# Patient Record
Sex: Male | Born: 1995 | Race: Black or African American | Hispanic: No | Marital: Single | Smoking: Current every day smoker
Health system: Southern US, Community
[De-identification: ages and names within clinical notes are randomized; demographics above are authoritative.]

## PROBLEM LIST (undated history)

## (undated) DIAGNOSIS — J45909 Unspecified asthma, uncomplicated: Secondary | ICD-10-CM

---

## 2016-04-14 ENCOUNTER — Emergency Department (HOSPITAL_BASED_OUTPATIENT_CLINIC_OR_DEPARTMENT_OTHER)
Admission: EM | Admit: 2016-04-14 | Discharge: 2016-04-14 | Disposition: A | Discharge status: Home / Self Care | Payer: Self-pay | Attending: Emergency Medicine | Admitting: Emergency Medicine

## 2016-04-14 ENCOUNTER — Encounter (HOSPITAL_BASED_OUTPATIENT_CLINIC_OR_DEPARTMENT_OTHER): Payer: Self-pay | Admitting: Emergency Medicine

## 2016-04-14 DIAGNOSIS — R1110 Vomiting, unspecified: Secondary | ICD-10-CM

## 2016-04-14 DIAGNOSIS — R112 Nausea with vomiting, unspecified: Secondary | ICD-10-CM | POA: Insufficient documentation

## 2016-04-14 DIAGNOSIS — R197 Diarrhea, unspecified: Secondary | ICD-10-CM | POA: Insufficient documentation

## 2016-04-14 DIAGNOSIS — F17210 Nicotine dependence, cigarettes, uncomplicated: Secondary | ICD-10-CM | POA: Insufficient documentation

## 2016-04-14 LAB — COMPREHENSIVE METABOLIC PANEL
ALBUMIN: 4.4 g/dL (ref 3.5–5.0)
ALK PHOS: 55 U/L (ref 38–126)
ALT: 13 U/L — AB (ref 17–63)
ANION GAP: 8 (ref 5–15)
AST: 22 U/L (ref 15–41)
BILIRUBIN TOTAL: 0.8 mg/dL (ref 0.3–1.2)
BUN: 6 mg/dL (ref 6–20)
CALCIUM: 9.3 mg/dL (ref 8.9–10.3)
CO2: 24 mmol/L (ref 22–32)
CREATININE: 0.83 mg/dL (ref 0.61–1.24)
Chloride: 106 mmol/L (ref 101–111)
GFR calc non Af Amer: >60 mL/min (ref >60)
GLUCOSE: 99 mg/dL (ref 65–99)
Potassium: 3.9 mmol/L (ref 3.5–5.1)
SODIUM: 138 mmol/L (ref 135–145)
TOTAL PROTEIN: 7.7 g/dL (ref 6.5–8.1)

## 2016-04-14 LAB — URINALYSIS, ROUTINE W REFLEX MICROSCOPIC
BILIRUBIN URINE: NEGATIVE
GLUCOSE, UA: NEGATIVE mg/dL
Hgb urine dipstick: NEGATIVE
KETONES UR: NEGATIVE mg/dL
Leukocytes, UA: NEGATIVE
NITRITE: NEGATIVE
PH: 6 (ref 5.0–8.0)
Protein, ur: NEGATIVE mg/dL
SPECIFIC GRAVITY, URINE: 1.025 (ref 1.005–1.030)

## 2016-04-14 LAB — CBC
HCT: 43.5 % (ref 39.0–52.0)
HEMOGLOBIN: 14.8 g/dL (ref 13.0–17.0)
MCH: 31.3 pg (ref 26.0–34.0)
MCHC: 34 g/dL (ref 30.0–36.0)
MCV: 92 fL (ref 78.0–100.0)
PLATELETS: 318 10*3/uL (ref 150–400)
RBC: 4.73 MIL/uL (ref 4.22–5.81)
RDW: 12.8 % (ref 11.5–15.5)
WBC: 8.8 10*3/uL (ref 4.0–10.5)

## 2016-04-14 LAB — LIPASE, BLOOD: Lipase: 12 U/L (ref 11–51)

## 2016-04-14 MED ORDER — SODIUM CHLORIDE 0.9 % IV BOLUS (SEPSIS)
1000.0000 mL | Freq: Once | INTRAVENOUS | Status: AC
  Administered 2016-04-14: 1000 mL via INTRAVENOUS

## 2016-04-14 MED ORDER — ONDANSETRON HCL 4 MG/2ML IJ SOLN
4.0000 mg | Freq: Once | INTRAMUSCULAR | Status: AC
  Administered 2016-04-14: 4 mg via INTRAVENOUS
  Filled 2016-04-14: qty 2

## 2016-04-14 MED ORDER — PROMETHAZINE HCL 25 MG PO TABS: 25.0000 mg | ORAL_TABLET | Freq: Four times a day (QID) | ORAL | Status: DC | PRN

## 2016-04-14 NOTE — Discharge Instructions (Signed)

## 2016-04-14 NOTE — ED Notes (Signed)
Pt called in waiting area, no response.

## 2016-04-14 NOTE — ED Notes (Signed)
Pt reports abdominal pain associated with n/v and diarrhea since 7am

## 2016-04-14 NOTE — ED Provider Notes (Signed)
CSN: [id number]     Arrival date & time 04/14/16  1159 History  By signing my name below, I, Jerry Acosta, attest that this documentation has been prepared under the direction and in the presence of Linwood DibblesJon Quinteria Chisum, MD. Electronically Signed: Bethel BornBritney Acosta, ED Scribe. 04/14/2016. 3:54 PM   Chief Complaint  Patient presents with  . Abdominal Pain   The history is provided by the patient. No language interpreter was used.   Jerry Acosta is a 20 y.o. male who presents to the Emergency Department complaining of new, constant, diffuse abdominal pain with onset this morning. The pain is exacerbated by certain movements though he notes that it has improved since initial onset. Associated symptoms include 8 episodes of emesis and 3 episodes of diarrhea today. Pt denies fever and difficulty urinating.   History reviewed. No pertinent past medical history. History reviewed. No pertinent past surgical history. History reviewed. No pertinent family history. Social History  Substance Use Topics  . Smoking status: Current Every Day Smoker -- 0.50 packs/day    Types: Cigarettes  . Smokeless tobacco: None  . Alcohol Use: No    Review of Systems  Constitutional: Negative for fever.  Gastrointestinal: Positive for nausea, vomiting, abdominal pain and diarrhea. Negative for blood in stool.  Genitourinary: Negative for difficulty urinating.  All other systems reviewed and are negative.  Allergies  Review of patient's allergies indicates not on file.  Home Medications   Prior to Admission medications   Medication Sig Start Date End Date Taking? Authorizing Provider  promethazine (PHENERGAN) 25 MG tablet Take 1 tablet (25 mg total) by mouth every 6 (six) hours as needed for nausea or vomiting. 04/14/16   Linwood DibblesJon Ajit Errico, MD   BP 133/59 mmHg  Pulse 78  Temp(Src) 97.7 F (36.5 C) (Oral)  Resp 18  Ht 5\' 6"  (1.676 m)  Wt 130 lb (58.968 kg)  BMI 20.99 kg/m2  SpO2 100% Physical Exam  Constitutional: He  appears well-developed and well-nourished. No distress.  HENT:  Head: Normocephalic and atraumatic.  Right Ear: External ear normal.  Left Ear: External ear normal.  Eyes: Conjunctivae are normal. Right eye exhibits no discharge. Left eye exhibits no discharge. No scleral icterus.  Neck: Neck supple. No tracheal deviation present.  Cardiovascular: Normal rate, regular rhythm and intact distal pulses.   Pulmonary/Chest: Effort normal and breath sounds normal. No stridor. No respiratory distress. He has no wheezes. He has no rales.  Abdominal: Soft. Bowel sounds are normal. He exhibits no distension. There is no tenderness. There is no rebound and no guarding.  Musculoskeletal: He exhibits no edema or tenderness.  Neurological: He is alert. He has normal strength. No cranial nerve deficit (no facial droop, extraocular movements intact, no slurred speech) or sensory deficit. He exhibits normal muscle tone. He displays no seizure activity. Coordination normal.  Skin: Skin is warm and dry. No rash noted.  Psychiatric: He has a normal mood and affect.  Nursing note and vitals reviewed.   ED Course  Procedures (including critical care time) DIAGNOSTIC STUDIES: Oxygen Saturation is 100% on RA,  normal by my interpretation.    COORDINATION OF CARE: 3:23 PM Discussed treatment plan which includes lab work, IVF, and antiemetic medication with pt at bedside and pt agreed to plan.  Labs Review Labs Reviewed  COMPREHENSIVE METABOLIC PANEL - Abnormal; Notable for the following:    ALT 13 (*)    All other components within normal limits  URINALYSIS, ROUTINE W REFLEX MICROSCOPIC (NOT AT  [facility])  LIPASE, BLOOD  CBC     MDM   Final diagnoses:  Vomiting and diarrhea   The patient's abdominal exam is reassuring. He has no tenderness. I suspect his symptoms may be related to a viral illness. I doubt appendicitis. Plan on discharge home with prescription for Phenergan. Warning signs and precautions  discussed.  I personally performed the services described in this documentation, which was scribed in my presence.  The recorded information has been reviewed and is accurate.   Linwood Dibbles, MD 04/14/16 [phone]

## 2017-05-28 ENCOUNTER — Encounter (HOSPITAL_BASED_OUTPATIENT_CLINIC_OR_DEPARTMENT_OTHER): Payer: Self-pay | Admitting: Emergency Medicine

## 2017-05-28 ENCOUNTER — Emergency Department (HOSPITAL_BASED_OUTPATIENT_CLINIC_OR_DEPARTMENT_OTHER): Payer: Medicaid Other

## 2017-05-28 ENCOUNTER — Emergency Department (HOSPITAL_BASED_OUTPATIENT_CLINIC_OR_DEPARTMENT_OTHER)
Admission: EM | Admit: 2017-05-28 | Discharge: 2017-05-28 | Disposition: A | Discharge status: Home / Self Care | Payer: Medicaid Other | Attending: Emergency Medicine | Admitting: Emergency Medicine

## 2017-05-28 DIAGNOSIS — R0602 Shortness of breath: Secondary | ICD-10-CM | POA: Insufficient documentation

## 2017-05-28 DIAGNOSIS — R509 Fever, unspecified: Secondary | ICD-10-CM | POA: Insufficient documentation

## 2017-05-28 DIAGNOSIS — F17210 Nicotine dependence, cigarettes, uncomplicated: Secondary | ICD-10-CM | POA: Insufficient documentation

## 2017-05-28 DIAGNOSIS — J45909 Unspecified asthma, uncomplicated: Secondary | ICD-10-CM | POA: Insufficient documentation

## 2017-05-28 DIAGNOSIS — J189 Pneumonia, unspecified organism: Secondary | ICD-10-CM | POA: Insufficient documentation

## 2017-05-28 DIAGNOSIS — R0981 Nasal congestion: Secondary | ICD-10-CM | POA: Insufficient documentation

## 2017-05-28 HISTORY — DX: Unspecified asthma, uncomplicated: J45.909

## 2017-05-28 LAB — COMPREHENSIVE METABOLIC PANEL
ALBUMIN: 3.7 g/dL (ref 3.5–5.0)
ALT: 15 U/L — ABNORMAL LOW (ref 17–63)
AST: 27 U/L (ref 15–41)
Alkaline Phosphatase: 46 U/L (ref 38–126)
Anion gap: 10 (ref 5–15)
BUN: 7 mg/dL (ref 6–20)
CO2: 23 mmol/L (ref 22–32)
Calcium: 8.6 mg/dL — ABNORMAL LOW (ref 8.9–10.3)
Chloride: 104 mmol/L (ref 101–111)
Creatinine, Ser: 0.84 mg/dL (ref 0.61–1.24)
GFR calc Af Amer: >60 mL/min (ref >60)
GFR calc non Af Amer: >60 mL/min (ref >60)
GLUCOSE: 125 mg/dL — AB (ref 65–99)
POTASSIUM: 3.5 mmol/L (ref 3.5–5.1)
SODIUM: 137 mmol/L (ref 135–145)
Total Bilirubin: 0.7 mg/dL (ref 0.3–1.2)
Total Protein: 6.3 g/dL — ABNORMAL LOW (ref 6.5–8.1)

## 2017-05-28 LAB — CBC WITH DIFFERENTIAL/PLATELET
Basophils Absolute: 0 10*3/uL (ref 0.0–0.1)
Basophils Relative: 0 %
Eosinophils Absolute: 0 10*3/uL (ref 0.0–0.7)
Eosinophils Relative: 0 %
HEMATOCRIT: 39.8 % (ref 39.0–52.0)
Hemoglobin: 13.6 g/dL (ref 13.0–17.0)
LYMPHS PCT: 6 %
Lymphs Abs: 0.6 10*3/uL — ABNORMAL LOW (ref 0.7–4.0)
MCH: 31.1 pg (ref 26.0–34.0)
MCHC: 34.2 g/dL (ref 30.0–36.0)
MCV: 90.9 fL (ref 78.0–100.0)
MONO ABS: 1.3 10*3/uL — AB (ref 0.1–1.0)
MONOS PCT: 13 %
NEUTROS ABS: 8.2 10*3/uL — AB (ref 1.7–7.7)
Neutrophils Relative %: 81 %
Platelets: 308 10*3/uL (ref 150–400)
RBC: 4.38 MIL/uL (ref 4.22–5.81)
RDW: 13 % (ref 11.5–15.5)
WBC: 10.2 10*3/uL (ref 4.0–10.5)

## 2017-05-28 MED ORDER — AZITHROMYCIN 250 MG PO TABS
500.0000 mg | ORAL_TABLET | Freq: Once | ORAL | Status: AC
Start: 2017-05-28 — End: 2017-05-28
  Administered 2017-05-28: 500 mg via ORAL
  Filled 2017-05-28: qty 2

## 2017-05-28 MED ORDER — AEROCHAMBER PLUS W/MASK MISC
1.0000 | Freq: Once | Status: DC
  Filled 2017-05-28: qty 1

## 2017-05-28 MED ORDER — ONDANSETRON 4 MG PO TBDP: 4.0000 mg | ORAL_TABLET | ORAL | 0 refills | Status: DC | PRN

## 2017-05-28 MED ORDER — IBUPROFEN 800 MG PO TABS
800.0000 mg | ORAL_TABLET | Freq: Once | ORAL | Status: AC
  Administered 2017-05-28: 800 mg via ORAL
  Filled 2017-05-28: qty 1

## 2017-05-28 MED ORDER — IBUPROFEN 800 MG PO TABS: 800.0000 mg | ORAL_TABLET | Freq: Three times a day (TID) | ORAL | 0 refills | Status: DC

## 2017-05-28 MED ORDER — ALBUTEROL SULFATE HFA 108 (90 BASE) MCG/ACT IN AERS: 2.0000 | INHALATION_SPRAY | Freq: Once | RESPIRATORY_TRACT | Status: DC

## 2017-05-28 MED ORDER — ALBUTEROL SULFATE HFA 108 (90 BASE) MCG/ACT IN AERS
2.0000 | INHALATION_SPRAY | Freq: Once | RESPIRATORY_TRACT | Status: AC
  Administered 2017-05-28: 2 via RESPIRATORY_TRACT
  Filled 2017-05-28: qty 6.7

## 2017-05-28 MED ORDER — ALBUTEROL SULFATE (2.5 MG/3ML) 0.083% IN NEBU
2.5000 mg | INHALATION_SOLUTION | Freq: Once | RESPIRATORY_TRACT | Status: AC
  Administered 2017-05-28: 2.5 mg via RESPIRATORY_TRACT
  Filled 2017-05-28: qty 3

## 2017-05-28 MED ORDER — IPRATROPIUM-ALBUTEROL 0.5-2.5 (3) MG/3ML IN SOLN
3.0000 mL | Freq: Once | RESPIRATORY_TRACT | Status: AC
  Administered 2017-05-28: 3 mL via RESPIRATORY_TRACT
  Filled 2017-05-28: qty 3

## 2017-05-28 MED ORDER — ACETAMINOPHEN 325 MG PO TABS
650.0000 mg | ORAL_TABLET | Freq: Once | ORAL | Status: AC
  Administered 2017-05-28: 650 mg via ORAL
  Filled 2017-05-28: qty 2

## 2017-05-28 MED ORDER — AZITHROMYCIN 250 MG PO TABS: 250.0000 mg | ORAL_TABLET | Freq: Every day | ORAL | 0 refills | Status: DC

## 2017-05-28 NOTE — ED Triage Notes (Signed)
Patient states that he has had a cough with SOb and fever since last night. The patient reports a PMHX of Asthma but has not needed medication in a while. Patient has nasal congestion and strong cough

## 2017-05-28 NOTE — ED Notes (Signed)
IV d/c from the right ac by EMT, [place]. Catheter intact.

## 2017-05-28 NOTE — ED Notes (Signed)
Pt vomited after the Tylenol was given. States he feels better. Nausea is now gone.

## 2017-05-28 NOTE — Discharge Instructions (Signed)
Your HIV test results are not going to be done during your visit to the emergency department. You should be called with a positive result, but be sure [facility]. You should see a family doctor for recheck within 3-5 days. Return to the emergency department if your symptoms are worsening.

## 2017-05-28 NOTE — ED Provider Notes (Signed)
[facility]   CSN: [id number] Arrival date & time: 05/28/17  1848     History   Chief Complaint Chief Complaint  Patient presents with  . Cough    HPI Jerry FillersLamont Acosta is a 21 y.o. male.  HPI Cough onset yesterday. Patient has been experiencing chills and sweats. Recent onset he also is having some nasal congestion. He reports he developed some shortness of breath and wheezing. He reports he has history of asthma but has not been a problem since he was a small child. He reports he is having some right-sided chest pain with cough. He tried some over-the-counter cough preparation before coming to the hospital and ended up vomiting it back up. He denies IV drug abuse. He reports he is sexually active with women. He reports it has been a long time however since he has had HIV testing and thinks that it wouldn't be a bad idea to get it. He has no other known medical history. Past Medical History:  Diagnosis Date  . Asthma     There are no active problems to display for this patient.   History reviewed. No pertinent surgical history.     Home Medications    Prior to Admission medications   Medication Sig Start Date End Date Taking? Authorizing Provider  azithromycin (ZITHROMAX) 250 MG tablet Take 1 tablet (250 mg total) by mouth daily. 05/28/17   Arby BarrettePfeiffer, Catalena Stanhope, MD  ibuprofen (ADVIL,MOTRIN) 800 MG tablet Take 1 tablet (800 mg total) by mouth 3 (three) times daily. 05/28/17   Arby BarrettePfeiffer, Whisper Kurka, MD  ondansetron (ZOFRAN ODT) 4 MG disintegrating tablet Take 1 tablet (4 mg total) by mouth every 4 (four) hours as needed for nausea or vomiting. 05/28/17   Arby BarrettePfeiffer, Samina Weekes, MD  promethazine (PHENERGAN) 25 MG tablet Take 1 tablet (25 mg total) by mouth every 6 (six) hours as needed for nausea or vomiting. 04/14/16   Linwood DibblesKnapp, Jon, MD    Family History History reviewed. No pertinent family history.  Social History Social History  Substance Use Topics  . Smoking status:  Current Every Day Smoker    Packs/day: 0.50    Types: Cigarettes  . Smokeless tobacco: Never Used  . Alcohol use No     Allergies   Patient has no known allergies.   Review of Systems Review of Systems 10 Systems reviewed and are negative for acute change except as noted in the HPI.   Physical Exam Updated Vital Signs BP 133/67 (BP Location: Right Arm)   Pulse 92   Temp (!) 100.6 F (38.1 C) (Oral)   Resp (!) 22   Wt 59 kg (130 lb)   SpO2 100%   BMI 20.98 kg/m   Physical Exam  Constitutional: He is oriented to person, place, and time. He appears well-developed and well-nourished.  Patient is alert and nontoxic. He does not have respiratory distress at rest. He does have intermittent harsh cough. He appears mildly ill but is not in any distress.  HENT:  Head: Normocephalic and atraumatic.  Bilateral TMs normal. Posterior oropharynx widely patent. No tonsillar enlargement or anxiety.  Eyes: Pupils are equal, round, and reactive to light. Conjunctivae and EOM are normal.  Neck: Neck supple.  No meningismus or lymphadenopathy.  Cardiovascular: Regular rhythm.   No murmur heard. Mild tachycardia.  Pulmonary/Chest: Effort normal and breath sounds normal. No respiratory distress.  Occasional harsh cough with deep inspiration. Breath sounds normal throughout.  Abdominal: Soft. He exhibits no distension. There is  no tenderness. There is no guarding.  Musculoskeletal: Normal range of motion. He exhibits no edema or tenderness.  Neurological: He is alert and oriented to person, place, and time. No cranial nerve deficit. He exhibits normal muscle tone. Coordination normal.  Skin: Skin is warm and dry.  Psychiatric: He has a normal mood and affect.  Nursing note and vitals reviewed.    ED Treatments / Results  Labs (all labs ordered are listed, but only abnormal results are displayed) Labs Reviewed  COMPREHENSIVE METABOLIC PANEL - Abnormal; Notable for the following:        Result Value   Glucose, Bld 125 (*)    Calcium 8.6 (*)    Total Protein 6.3 (*)    ALT 15 (*)    All other components within normal limits  CBC WITH DIFFERENTIAL/PLATELET - Abnormal; Notable for the following:    Neutro Abs 8.2 (*)    Lymphs Abs 0.6 (*)    Monocytes Absolute 1.3 (*)    All other components within normal limits  HIV ANTIBODY (ROUTINE TESTING)    EKG  EKG Interpretation None       Radiology Dg Chest 2 View  Result Date: 05/28/2017 CLINICAL DATA:  Cough and nasal congestion EXAM: CHEST  2 VIEW COMPARISON:  None. FINDINGS: Normal heart size. Lungs clear. No pneumothorax. No pleural effusion. IMPRESSION: No active cardiopulmonary disease. Electronically Signed   By: Jolaine ClickArthur  Hoss M.D.   On: 05/28/2017 19:33    Procedures Procedures (including critical care time)  Medications Ordered in ED Medications  albuterol (PROVENTIL HFA;VENTOLIN HFA) 108 (90 Base) MCG/ACT inhaler 2 puff (not administered)  aerochamber plus with mask device 1 each (not administered)  ipratropium-albuterol (DUONEB) 0.5-2.5 (3) MG/3ML nebulizer solution 3 mL (3 mLs Nebulization Given 05/28/17 1909)  albuterol (PROVENTIL) (2.5 MG/3ML) 0.083% nebulizer solution 2.5 mg (2.5 mg Nebulization Given 05/28/17 1909)  acetaminophen (TYLENOL) tablet 650 mg (650 mg Oral Given 05/28/17 1911)  albuterol (PROVENTIL HFA;VENTOLIN HFA) 108 (90 Base) MCG/ACT inhaler 2 puff (2 puffs Inhalation Given 05/28/17 1931)  ibuprofen (ADVIL,MOTRIN) tablet 800 mg (800 mg Oral Given 05/28/17 2035)  azithromycin (ZITHROMAX) tablet 500 mg (500 mg Oral Given 05/28/17 2035)     Initial Impression / Assessment and Plan / ED Course  I have reviewed the triage vital signs and the nursing notes.  Pertinent labs & imaging results that were available during my care of the patient were reviewed by me and considered in my medical decision making (see chart for details).      Final Clinical Impressions(s) / ED Diagnoses   Final  diagnoses:  Community acquired pneumonia, unspecified laterality   Patient has cough with right-sided chest pain with cough and fever. I will opt to treat for community-acquired pneumonia. The patient had wheezing that was improved with albuterol. We'll also dispense inhaler. Patient denies IV drug abuse or specific HIV risk factors but does feel a testing is appropriate. At this time, I do not have any specific indication to suspect HIV. Patient is counseled on following up on results. Patient is nontoxic and alert. He does not have respiratory distress. He does have harsh cough and fever. Return precautions are reviewed. New Prescriptions New Prescriptions   AZITHROMYCIN (ZITHROMAX) 250 MG TABLET    Take 1 tablet (250 mg total) by mouth daily.   IBUPROFEN (ADVIL,MOTRIN) 800 MG TABLET    Take 1 tablet (800 mg total) by mouth 3 (three) times daily.   ONDANSETRON (ZOFRAN ODT) 4 MG DISINTEGRATING  TABLET    Take 1 tablet (4 mg total) by mouth every 4 (four) hours as needed for nausea or vomiting.     Arby Barrette, MD 05/28/17 2116

## 2017-05-30 LAB — HIV ANTIBODY (ROUTINE TESTING W REFLEX): HIV SCREEN 4TH GENERATION: NONREACTIVE

## 2018-09-21 ENCOUNTER — Other Ambulatory Visit: Payer: Self-pay

## 2018-09-21 ENCOUNTER — Encounter (HOSPITAL_BASED_OUTPATIENT_CLINIC_OR_DEPARTMENT_OTHER): Payer: Self-pay | Admitting: *Deleted

## 2018-09-21 ENCOUNTER — Emergency Department (HOSPITAL_BASED_OUTPATIENT_CLINIC_OR_DEPARTMENT_OTHER)
Admission: EM | Admit: 2018-09-21 | Discharge: 2018-09-21 | Disposition: A | Discharge status: Home / Self Care | Payer: Self-pay | Attending: Emergency Medicine | Admitting: Emergency Medicine

## 2018-09-21 DIAGNOSIS — R197 Diarrhea, unspecified: Secondary | ICD-10-CM | POA: Insufficient documentation

## 2018-09-21 DIAGNOSIS — R109 Unspecified abdominal pain: Secondary | ICD-10-CM | POA: Insufficient documentation

## 2018-09-21 DIAGNOSIS — J45909 Unspecified asthma, uncomplicated: Secondary | ICD-10-CM | POA: Insufficient documentation

## 2018-09-21 DIAGNOSIS — R110 Nausea: Secondary | ICD-10-CM | POA: Insufficient documentation

## 2018-09-21 DIAGNOSIS — F17210 Nicotine dependence, cigarettes, uncomplicated: Secondary | ICD-10-CM | POA: Insufficient documentation

## 2018-09-21 LAB — CBC
HEMATOCRIT: 44 % (ref 39.0–52.0)
Hemoglobin: 13.9 g/dL (ref 13.0–17.0)
MCH: 30.8 pg (ref 26.0–34.0)
MCHC: 31.6 g/dL (ref 30.0–36.0)
MCV: 97.3 fL (ref 80.0–100.0)
PLATELETS: 364 10*3/uL (ref 150–400)
RBC: 4.52 MIL/uL (ref 4.22–5.81)
RDW: 13.4 % (ref 11.5–15.5)
WBC: 6.7 10*3/uL (ref 4.0–10.5)
nRBC: 0 % (ref 0.0–0.2)

## 2018-09-21 LAB — COMPREHENSIVE METABOLIC PANEL
ALK PHOS: 70 U/L (ref 38–126)
ALT: 19 U/L (ref 0–44)
AST: 25 U/L (ref 15–41)
Albumin: 3.7 g/dL (ref 3.5–5.0)
Anion gap: 6 (ref 5–15)
BUN: 13 mg/dL (ref 6–20)
CALCIUM: 9.1 mg/dL (ref 8.9–10.3)
CHLORIDE: 107 mmol/L (ref 98–111)
CO2: 24 mmol/L (ref 22–32)
CREATININE: 0.86 mg/dL (ref 0.61–1.24)
GFR calc Af Amer: >60 mL/min (ref >60)
GFR calc non Af Amer: >60 mL/min (ref >60)
GLUCOSE: 106 mg/dL — AB (ref 70–99)
Potassium: 3.9 mmol/L (ref 3.5–5.1)
SODIUM: 137 mmol/L (ref 135–145)
Total Bilirubin: 0.4 mg/dL (ref 0.3–1.2)
Total Protein: 6.5 g/dL (ref 6.5–8.1)

## 2018-09-21 LAB — URINALYSIS, ROUTINE W REFLEX MICROSCOPIC
BILIRUBIN URINE: NEGATIVE
Glucose, UA: NEGATIVE mg/dL
Hgb urine dipstick: NEGATIVE
KETONES UR: NEGATIVE mg/dL
LEUKOCYTES UA: NEGATIVE
Nitrite: NEGATIVE
PH: 7 (ref 5.0–8.0)
Protein, ur: NEGATIVE mg/dL
Specific Gravity, Urine: 1.02 (ref 1.005–1.030)

## 2018-09-21 LAB — LIPASE, BLOOD: LIPASE: 31 U/L (ref 11–51)

## 2018-09-21 MED ORDER — ONDANSETRON 4 MG PO TBDP: 4.0000 mg | ORAL_TABLET | ORAL | 0 refills | Status: DC | PRN

## 2018-09-21 NOTE — ED Notes (Signed)
Patient was seen eating and drinking when EMT called him to go to the room.

## 2018-09-21 NOTE — ED Provider Notes (Signed)
[facility] EMERGENCY DEPARTMENT Provider Note   CSN: [id number] Arrival date & time: 09/21/18  4098     History   Chief Complaint Chief Complaint  Patient presents with  . Abdominal Pain    HPI Jerry Acosta is a 22 y.o. male history of asthma, here presenting with diarrhea, abdominal cramping.  Patient states that he has diffuse abdominal cramps and loose stools for the last 2 days.  Patient states that he is nauseated but has no vomiting.  He denies any recent travel or sick contacts or eating uncooked meat.  Denies any fevers.   The history is provided by the patient.    Past Medical History:  Diagnosis Date  . Asthma     There are no active problems to display for this patient.   History reviewed. No pertinent surgical history.      Home Medications    Prior to Admission medications   Medication Sig Start Date End Date Taking? Authorizing Provider  azithromycin (ZITHROMAX) 250 MG tablet Take 1 tablet (250 mg total) by mouth daily. 05/28/17   Arby Barrette, MD  ibuprofen (ADVIL,MOTRIN) 800 MG tablet Take 1 tablet (800 mg total) by mouth 3 (three) times daily. 05/28/17   Arby Barrette, MD  ondansetron (ZOFRAN ODT) 4 MG disintegrating tablet Take 1 tablet (4 mg total) by mouth every 4 (four) hours as needed for nausea or vomiting. 05/28/17   Arby Barrette, MD  promethazine (PHENERGAN) 25 MG tablet Take 1 tablet (25 mg total) by mouth every 6 (six) hours as needed for nausea or vomiting. 04/14/16   Linwood Dibbles, MD    Family History No family history on file.  Social History Social History   Tobacco Use  . Smoking status: Current Every Day Smoker    Packs/day: 0.50    Types: Cigarettes  . Smokeless tobacco: Never Used  Substance Use Topics  . Alcohol use: Yes    Comment: 2x month  . Drug use: Never     Allergies   Patient has no known allergies.   Review of Systems Review of Systems  Gastrointestinal: Positive for abdominal pain and  diarrhea.  All other systems reviewed and are negative.    Physical Exam Updated Vital Signs BP 139/70 (BP Location: Left Arm)   Pulse 91   Temp 99 F (37.2 C) (Oral)   Resp 18   Ht 5\' 6"  (1.676 m)   Wt 59 kg   SpO2 99%   BMI 20.98 kg/m   Physical Exam  Constitutional: He is oriented to person, place, and time. He appears well-developed.  HENT:  Head: Normocephalic.  Mouth/Throat: Oropharynx is clear and moist.  Eyes: Pupils are equal, round, and reactive to light. EOM are normal.  Cardiovascular: Normal rate.  Pulmonary/Chest: Effort normal and breath sounds normal.  Abdominal: Soft. Normal appearance and bowel sounds are normal.  Neurological: He is alert and oriented to person, place, and time.  Skin: Skin is warm. Capillary refill takes less than 2 seconds.  Psychiatric: He has a normal mood and affect. His behavior is normal.  Nursing note and vitals reviewed.    ED Treatments / Results  Labs (all labs ordered are listed, but only abnormal results are displayed) Labs Reviewed  COMPREHENSIVE METABOLIC PANEL - Abnormal; Notable for the following components:      Result Value   Glucose, Bld 106 (*)    All other components within normal limits  LIPASE, BLOOD  CBC  URINALYSIS, ROUTINE W REFLEX  MICROSCOPIC    EKG None  Radiology No results found.  Procedures Procedures (including critical care time)  Medications Ordered in ED Medications - No data to display   Initial Impression / Assessment and Plan / ED Course  I have reviewed the triage vital signs and the nursing notes.  Pertinent labs & imaging results that were available during my care of the patient were reviewed by me and considered in my medical decision making (see chart for details).    [name] is a 22 y.o. male here with abdominal cramps, diarrhea. Well appearing, abdomen nontender. Afebrile. Labs unremarkable. Likely viral gastro. Will dc home with zofran prn. Will give imodium as  needed for diarrhea.    Final Clinical Impressions(s) / ED Diagnoses   Final diagnoses:  None    ED Discharge Orders    None       Charlynne PanderYao, David Hsienta, MD 09/21/18 2103

## 2018-09-21 NOTE — Discharge Instructions (Signed)
Stay hydrated.   Take zofran as needed for nausea.   Take imodium for diarrhea, up to 10 times a day   See your doctor  Return to ER if you have worse abdominal pain, diarrhea, fever, vomiting, dehydration.

## 2018-09-21 NOTE — ED Triage Notes (Signed)
Pt reports lower abd pain and looses stools since Friday. Denies n/v

## 2018-11-07 ENCOUNTER — Encounter (HOSPITAL_BASED_OUTPATIENT_CLINIC_OR_DEPARTMENT_OTHER): Payer: Self-pay | Admitting: *Deleted

## 2018-11-07 ENCOUNTER — Emergency Department (HOSPITAL_BASED_OUTPATIENT_CLINIC_OR_DEPARTMENT_OTHER)
Admission: EM | Admit: 2018-11-07 | Discharge: 2018-11-07 | Disposition: A | Discharge status: Home / Self Care | Payer: Medicaid Other | Attending: Emergency Medicine | Admitting: Emergency Medicine

## 2018-11-07 ENCOUNTER — Other Ambulatory Visit: Payer: Self-pay

## 2018-11-07 DIAGNOSIS — J45909 Unspecified asthma, uncomplicated: Secondary | ICD-10-CM | POA: Insufficient documentation

## 2018-11-07 DIAGNOSIS — B9789 Other viral agents as the cause of diseases classified elsewhere: Secondary | ICD-10-CM

## 2018-11-07 DIAGNOSIS — F17210 Nicotine dependence, cigarettes, uncomplicated: Secondary | ICD-10-CM | POA: Insufficient documentation

## 2018-11-07 DIAGNOSIS — J069 Acute upper respiratory infection, unspecified: Secondary | ICD-10-CM | POA: Insufficient documentation

## 2018-11-07 MED ORDER — BENZONATATE 100 MG PO CAPS: 100.0000 mg | ORAL_CAPSULE | Freq: Three times a day (TID) | ORAL | 0 refills | Status: DC

## 2018-11-07 MED ORDER — BENZONATATE 100 MG PO CAPS
200.0000 mg | ORAL_CAPSULE | Freq: Once | ORAL | Status: AC
  Administered 2018-11-07: 200 mg via ORAL
  Filled 2018-11-07: qty 2

## 2018-11-07 NOTE — ED Notes (Addendum)
Pt presents with concerns he might have the flu. Pt reports his girlfriends children have the flu. Pt reports a fever however he has not take his temperature. Pt also endorses a sore throat. Pt states he has been feeling bad for 2 weeks.

## 2018-11-07 NOTE — ED Notes (Signed)
Patient verbalizes understanding of discharge instructions. Opportunity for questioning and answers were provided. Armband removed by staff, pt discharged from ED home via POV.  

## 2018-11-07 NOTE — ED Triage Notes (Signed)
Pt reports he has been sick x 2 weeks. States he thought he was getting better but today at work he had a headache and started feeling worse. Reports he vomited x 1 this evening. States also having productive cough

## 2018-11-07 NOTE — ED Provider Notes (Signed)
[facility] EMERGENCY DEPARTMENT Provider Note   CSN: [id number] Arrival date & time: 11/07/18  0016     History   Chief Complaint Chief Complaint  Patient presents with  . Cough    HPI Jerry Acosta is a 23 y.o. male.  The history is provided by the patient.  URI  Presenting symptoms: congestion and cough   Presenting symptoms: no fever and no sore throat   Severity:  Moderate Onset quality:  Gradual Duration:  1 week Timing:  Sporadic Progression:  Unchanged Chronicity:  New Relieved by:  Nothing Worsened by:  Nothing Ineffective treatments:  None tried Associated symptoms: no arthralgias, no myalgias, no neck pain and no wheezing   Risk factors: sick contacts   Risk factors: not elderly and no diabetes mellitus   family all has the same.    Past Medical History:  Diagnosis Date  . Asthma     There are no active problems to display for this patient.   History reviewed. No pertinent surgical history.      Home Medications    Prior to Admission medications   Medication Sig Start Date End Date Taking? Authorizing Provider  azithromycin (ZITHROMAX) 250 MG tablet Take 1 tablet (250 mg total) by mouth daily. 05/28/17   Arby Barrette, MD  ibuprofen (ADVIL,MOTRIN) 800 MG tablet Take 1 tablet (800 mg total) by mouth 3 (three) times daily. 05/28/17   Arby Barrette, MD  ondansetron (ZOFRAN ODT) 4 MG disintegrating tablet Take 1 tablet (4 mg total) by mouth every 4 (four) hours as needed for nausea or vomiting. 09/21/18   Charlynne Pander, MD  promethazine (PHENERGAN) 25 MG tablet Take 1 tablet (25 mg total) by mouth every 6 (six) hours as needed for nausea or vomiting. 04/14/16   Linwood Dibbles, MD    Family History No family history on file.  Social History Social History   Tobacco Use  . Smoking status: Current Every Day Smoker    Packs/day: 0.50    Types: Cigarettes  . Smokeless tobacco: Never Used  Substance Use Topics  . Alcohol use: Yes    Comment: 2x month  . Drug use: Never     Allergies   Patient has no known allergies.   Review of Systems Review of Systems  Constitutional: Negative for appetite change, diaphoresis and fever.  HENT: Positive for congestion. Negative for sore throat and voice change.   Eyes: Negative for photophobia.  Respiratory: Positive for cough. Negative for shortness of breath and wheezing.   Musculoskeletal: Negative for arthralgias, myalgias and neck pain.  All other systems reviewed and are negative.    Physical Exam Updated Vital Signs BP (!) 171/92 (BP Location: Right Arm)   Pulse 91   Temp 98.4 F (36.9 C) (Oral)   Resp 18   Ht 5\' 6"  (1.676 m)   Wt 59.9 kg   SpO2 100%   BMI 21.31 kg/m   Physical Exam Constitutional:      Appearance: Normal appearance. He is not ill-appearing.  HENT:     Head: Normocephalic and atraumatic.     Nose: Nose normal.     Mouth/Throat:     Mouth: Mucous membranes are moist.     Pharynx: Oropharynx is clear. No oropharyngeal exudate.  Eyes:     Conjunctiva/sclera: Conjunctivae normal.     Pupils: Pupils are equal, round, and reactive to light.  Neck:     Musculoskeletal: Normal range of motion and neck supple.  Cardiovascular:  Rate and Rhythm: Normal rate and regular rhythm.     Pulses: Normal pulses.     Heart sounds: Normal heart sounds.  Pulmonary:     Effort: Pulmonary effort is normal. No respiratory distress.     Breath sounds: Normal breath sounds. No stridor. No wheezing, rhonchi or rales.  Chest:     Chest wall: No tenderness.  Abdominal:     General: Abdomen is flat. Bowel sounds are normal.     Palpations: Abdomen is soft.     Tenderness: There is no abdominal tenderness.  Musculoskeletal: Normal range of motion.  Skin:    General: Skin is warm and dry.     Capillary Refill: Capillary refill takes less than 2 seconds.  Neurological:     General: No focal deficit present.     Mental Status: He is alert and oriented  to person, place, and time.  Psychiatric:        Mood and Affect: Mood normal.      ED Treatments / Results  Labs (all labs ordered are listed, but only abnormal results are displayed) Labs Reviewed - No data to display  EKG None  Radiology No results found.  Procedures Procedures (including critical care time)  Medications Ordered in ED Medications  benzonatate (TESSALON) capsule 200 mg (has no administration in time range)     Well appearing, lungs clear.  No signs of pneumonia no indication for imaging at this time.  Will treat symptomatically.    Final Clinical Impressions(s) / ED Diagnoses   Return for pain, intractable cough, productive cough,fevers >100.4 unrelieved by medication, shortness of breath, intractable vomiting, or diarrhea, abdominal pain, Inability to tolerate liquids or food, cough, altered mental status or any concerns. No signs of systemic illness or infection. The patient is nontoxic-appearing on exam and vital signs are within normal limits.   I have reviewed the triage vital signs and the nursing notes. Pertinent labs &imaging results that were available during my care of the patient were reviewed by me and considered in my medical decision making (see chart for details).  After history, exam, and medical workup I feel the patient has been appropriately medically screened and is safe for discharge home. Pertinent diagnoses were discussed with the patient. Patient was given return precautions.   Hye Trawick, MD 11/07/18 [phone]

## 2019-08-07 ENCOUNTER — Other Ambulatory Visit: Payer: Self-pay

## 2019-08-07 DIAGNOSIS — Z20822 Contact with and (suspected) exposure to covid-19: Secondary | ICD-10-CM

## 2019-08-09 LAB — NOVEL CORONAVIRUS, NAA: SARS-CoV-2, NAA: NOT DETECTED

## 2019-09-03 ENCOUNTER — Other Ambulatory Visit: Payer: Self-pay

## 2019-09-03 DIAGNOSIS — Z20822 Contact with and (suspected) exposure to covid-19: Secondary | ICD-10-CM

## 2019-09-05 LAB — NOVEL CORONAVIRUS, NAA: SARS-CoV-2, NAA: NOT DETECTED

## 2019-10-08 ENCOUNTER — Other Ambulatory Visit: Payer: Self-pay

## 2019-10-08 DIAGNOSIS — Z20822 Contact with and (suspected) exposure to covid-19: Secondary | ICD-10-CM

## 2019-10-11 LAB — NOVEL CORONAVIRUS, NAA

## 2019-11-10 ENCOUNTER — Emergency Department (HOSPITAL_BASED_OUTPATIENT_CLINIC_OR_DEPARTMENT_OTHER)
Admission: EM | Admit: 2019-11-10 | Discharge: 2019-11-10 | Disposition: A | Discharge status: Home / Self Care | Payer: Self-pay | Attending: Emergency Medicine | Admitting: Emergency Medicine

## 2019-11-10 ENCOUNTER — Encounter (HOSPITAL_BASED_OUTPATIENT_CLINIC_OR_DEPARTMENT_OTHER): Payer: Self-pay

## 2019-11-10 ENCOUNTER — Other Ambulatory Visit: Payer: Self-pay

## 2019-11-10 DIAGNOSIS — J45909 Unspecified asthma, uncomplicated: Secondary | ICD-10-CM | POA: Insufficient documentation

## 2019-11-10 DIAGNOSIS — R110 Nausea: Secondary | ICD-10-CM | POA: Insufficient documentation

## 2019-11-10 DIAGNOSIS — F17210 Nicotine dependence, cigarettes, uncomplicated: Secondary | ICD-10-CM | POA: Insufficient documentation

## 2019-11-10 DIAGNOSIS — R140 Abdominal distension (gaseous): Secondary | ICD-10-CM | POA: Insufficient documentation

## 2019-11-10 DIAGNOSIS — R55 Syncope and collapse: Secondary | ICD-10-CM | POA: Insufficient documentation

## 2019-11-10 DIAGNOSIS — R61 Generalized hyperhidrosis: Secondary | ICD-10-CM | POA: Insufficient documentation

## 2019-11-10 LAB — URINALYSIS, ROUTINE W REFLEX MICROSCOPIC
Bilirubin Urine: NEGATIVE
Glucose, UA: NEGATIVE mg/dL
Hgb urine dipstick: NEGATIVE
Ketones, ur: NEGATIVE mg/dL
Leukocytes,Ua: NEGATIVE
Nitrite: NEGATIVE
Protein, ur: NEGATIVE mg/dL
Specific Gravity, Urine: 1.02 (ref 1.005–1.030)
pH: 7.5 (ref 5.0–8.0)

## 2019-11-10 LAB — CBC
HCT: 50 % (ref 39.0–52.0)
Hemoglobin: 16.6 g/dL (ref 13.0–17.0)
MCH: 31.4 pg (ref 26.0–34.0)
MCHC: 33.2 g/dL (ref 30.0–36.0)
MCV: 94.5 fL (ref 80.0–100.0)
Platelets: 341 10*3/uL (ref 150–400)
RBC: 5.29 MIL/uL (ref 4.22–5.81)
RDW: 13 % (ref 11.5–15.5)
WBC: 6.2 10*3/uL (ref 4.0–10.5)
nRBC: 0 % (ref 0.0–0.2)

## 2019-11-10 LAB — RAPID URINE DRUG SCREEN, HOSP PERFORMED
Amphetamines: NOT DETECTED
Barbiturates: NOT DETECTED
Benzodiazepines: NOT DETECTED
Cocaine: NOT DETECTED
Opiates: NOT DETECTED
Tetrahydrocannabinol: POSITIVE — AB

## 2019-11-10 LAB — BASIC METABOLIC PANEL
Anion gap: 9 (ref 5–15)
BUN: 9 mg/dL (ref 6–20)
CO2: 25 mmol/L (ref 22–32)
Calcium: 8.4 mg/dL — ABNORMAL LOW (ref 8.9–10.3)
Chloride: 106 mmol/L (ref 98–111)
Creatinine, Ser: 0.88 mg/dL (ref 0.61–1.24)
GFR calc Af Amer: >60 mL/min (ref >60)
GFR calc non Af Amer: >60 mL/min (ref >60)
Glucose, Bld: 104 mg/dL — ABNORMAL HIGH (ref 70–99)
Potassium: 4 mmol/L (ref 3.5–5.1)
Sodium: 140 mmol/L (ref 135–145)

## 2019-11-10 LAB — TROPONIN I (HIGH SENSITIVITY): Troponin I (High Sensitivity): <2 ng/L (ref <18)

## 2019-11-10 NOTE — ED Notes (Signed)
Pt sts he "passes out" and "falls".  However he can remember the fall and never hits the floor hard enough to hurt himself.  Has never hit his head, although each time has been standing.

## 2019-11-10 NOTE — Discharge Instructions (Addendum)
You have been seen today for loss of consciousness. Please read and follow all provided instructions. Return to the emergency room for worsening condition or new concerning symptoms.    I discussed your case with the cardiologist today. He is recommending that you follow-up in the cardiology to be evaluated and potentially wear a heart monitor for short-term.  1. Medications:  Continue usual home medications Take medications as prescribed. Please review all of the medicines and only take them if you do not have an allergy to them.   2. Treatment: rest, drink plenty of fluids  3. Follow Up:  Please follow up with primary care provider by scheduling an appointment as soon as possible for a visit  If you do not have a primary care physician, contact [facility] at [phone] for referral  -I have also sent a referral to the cardiology office in [facility] to have you follow-up with them. The office should be calling you to set up the appointment. If you do not hear from them within a week you can try to call and schedule an appointment.   It is also a possibility that you have an allergic reaction to any of the medicines that you have been prescribed - Everybody reacts differently to medications and while MOST people have no trouble with most medicines, you may have a reaction such as nausea, vomiting, rash, swelling, shortness of breath. If this is the case, please stop taking the medicine immediately and contact your physician.  ?

## 2019-11-10 NOTE — ED Provider Notes (Signed)
[facility] EMERGENCY DEPARTMENT Provider Note   CSN: [id number] Arrival date & time: 11/10/19  1534     History Chief Complaint  Patient presents with  . Loss of Consciousness    Jerry Acosta is a 24 y.o. male with past medical history significant for asthma presents today with chief complaint of loss of consciousness.  Patient states he has had 3 episodes of loss of consciousness, the first was on 12/25, the second was 12/29, and the third was yesterday.  Patient states he has a feeling of sudden onset nausea, feeling like he has a pit in his stomach, he becomes diaphoretic, his vision starts to go black and then he will fall to the ground.  He states he will immediately get back up to each episode.  He feels like he has gas in his stomach and will belch multiple times and then feel better afterwards. He states this happened one other time a year ago but he never sought evaluation or treatment.  Denies any associated chest pain.  Denies any family history of cardiac disease.  He states several members his dad side of the family have had DVTs.  He states he drinks alcohol occasionally but does not seem to be associated with these episodes.  Denies any drug use.  He denies any fever, chills, headache, neck pain, visual changes, urinary symptoms, diarrhea, lower extremity edema..  He has not taken any medications for his symptoms prior to arrival.      Past Medical History:  Diagnosis Date  . Asthma     There are no problems to display for this patient.   History reviewed. No pertinent surgical history.     No family history on file.  Social History   Tobacco Use  . Smoking status: Current Every Day Smoker    Packs/day: 0.50    Types: Cigarettes  . Smokeless tobacco: Never Used  Substance Use Topics  . Alcohol use: Yes    Comment: 2x month  . Drug use: Never    Home Medications Prior to Admission medications   Medication Sig Start Date End Date Taking?  Authorizing Provider  azithromycin (ZITHROMAX) 250 MG tablet Take 1 tablet (250 mg total) by mouth daily. 05/28/17   Arby Barrette, MD  benzonatate (TESSALON) 100 MG capsule Take 1 capsule (100 mg total) by mouth every 8 (eight) hours. 11/07/18   Palumbo, April, MD  ibuprofen (ADVIL,MOTRIN) 800 MG tablet Take 1 tablet (800 mg total) by mouth 3 (three) times daily. 05/28/17   Arby Barrette, MD  ondansetron (ZOFRAN ODT) 4 MG disintegrating tablet Take 1 tablet (4 mg total) by mouth every 4 (four) hours as needed for nausea or vomiting. 09/21/18   Charlynne Pander, MD  promethazine (PHENERGAN) 25 MG tablet Take 1 tablet (25 mg total) by mouth every 6 (six) hours as needed for nausea or vomiting. 04/14/16   Linwood Dibbles, MD    Allergies    Patient has no known allergies.  Review of Systems   Review of Systems All other systems are reviewed and are negative for acute change except as noted in the HPI.  Physical Exam Updated Vital Signs BP (!) 152/81 (BP Location: Left Arm)   Pulse 66   Temp 98.8 F (37.1 C) (Oral)   Resp 16   Ht 5\' 6"  (1.676 m)   Wt 63.5 kg   SpO2 100%   BMI 22.60 kg/m   Physical Exam Vitals and nursing note reviewed.  Constitutional:  General: He is not in acute distress.    Appearance: He is not ill-appearing.  HENT:     Head: Normocephalic and atraumatic.     Comments: No tenderness to palpation of skull. No deformities or crepitus noted. No open wounds, abrasions or lacerations.    Right Ear: Tympanic membrane and external ear normal.     Left Ear: Tympanic membrane and external ear normal.     Nose: Nose normal.     Mouth/Throat:     Mouth: Mucous membranes are moist.     Pharynx: Oropharynx is clear.  Eyes:     General: No scleral icterus.       Right eye: No discharge.        Left eye: No discharge.     Extraocular Movements: Extraocular movements intact.     Conjunctiva/sclera: Conjunctivae normal.     Pupils: Pupils are equal, round, and  reactive to light.  Neck:     Vascular: No JVD.  Cardiovascular:     Rate and Rhythm: Normal rate and regular rhythm.     Pulses: Normal pulses.          Radial pulses are 2+ on the right side and 2+ on the left side.     Heart sounds: Normal heart sounds.  Pulmonary:     Comments: Lungs clear to auscultation in all fields. Symmetric chest rise. No wheezing, rales, or rhonchi. Chest:     Chest wall: No tenderness.  Abdominal:     General: Bowel sounds are normal.     Comments: Abdomen is soft, non-distended, and non-tender in all quadrants. No rigidity, no guarding. No peritoneal signs.  Musculoskeletal:        General: Normal range of motion.     Cervical back: Normal range of motion.     Right lower leg: No edema.     Left lower leg: No edema.  Skin:    General: Skin is warm and dry.     Capillary Refill: Capillary refill takes less than 2 seconds.     Findings: No rash.  Neurological:     Mental Status: He is oriented to person, place, and time.     GCS: GCS eye subscore is 4. GCS verbal subscore is 5. GCS motor subscore is 6.     Comments: Speech is clear and goal oriented, follows commands CN III-XII intact, no facial droop Normal strength in upper and lower extremities bilaterally including dorsiflexion and plantar flexion, strong and equal grip strength Sensation normal to light and sharp touch Moves extremities without ataxia, coordination intact Normal finger to nose and rapid alternating movements Normal gait and balance  Psychiatric:        Behavior: Behavior normal.     ED Results / Procedures / Treatments   Labs (all labs ordered are listed, but only abnormal results are displayed) Labs Reviewed  BASIC METABOLIC PANEL - Abnormal; Notable for the following components:      Result Value   Glucose, Bld 104 (*)    Calcium 8.4 (*)    All other components within normal limits  CBC  TROPONIN I (HIGH SENSITIVITY)    EKG EKG  Interpretation  Date/Time:  Tuesday November 10 2019 19:20:44 EST Ventricular Rate:  61 PR Interval:  128 QRS Duration: 92 QT Interval:  396 QTC Calculation: 398 R Axis:   91 Text Interpretation: Normal sinus rhythm Rightward axis Borderline ECG agree. no old comparison. Confirmed by Arby Barrette [phone]) on 11/10/2019 8:38:19  PM   Radiology No results found.  Procedures Procedures (including critical care time)  Medications Ordered in ED Medications - No data to display  ED Course  I have reviewed the triage vital signs and the nursing notes.  Pertinent labs & imaging results that were available during my care of the patient were reviewed by me and considered in my medical decision making (see chart for details).    MDM Rules/Calculators/A&P                      Patient seen and examined. Patient presents awake, alert, hemodynamically stable, afebrile, non toxic.  My exam lungs are clear to auscultation all fields.  No abdominal tenderness, no peritoneal signs.  Normal neuro exam.  I reviewed labs ordered in triage including BMP and CBC.  No significant findings.  No leukocytosis, no anemia, no severe electrolyte derangement, no renal insufficiency. Troponin is <2.  EKG viewed by myself and ED attending without ischemia, probable early repole pattern. There does not appear to be any arrhythmia or Brugada pattern. This appears to be more consistent with vasovagal syncope.  Case discussed with on-call cardiologist Dr. Emilio Aspen who personally reviewed EKG and recommends patient follow-up outpatient with PCP or cardiology for possible holter monitor and further testing.  The patient appears reasonably screened and/or stabilized for discharge and I doubt any other medical condition or other [facility] requiring further screening, evaluation, or treatment in the ED at this time prior to discharge. The patient is safe for discharge with strict return precautions discussed. Ambulatory referral  sent to cardiology as patient does not have PCP follow-up with. Did discuss the importance of getting primary care provider with patient and provided resource list. This has been a shared visit with ED attending Dr. Johnney Killian, she personally saw and evaluated the patient.  Portions of this note were generated with [employer]. Dictation errors may occur despite best attempts at proofreading.   Final Clinical Impression(s) / ED Diagnoses Final diagnoses:  Vasovagal syncope    Rx / DC Orders ED Discharge Orders         Ordered    Ambulatory referral to Cardiology     11/10/19 2053           Flint Melter 11/10/19 2059    Charlesetta Shanks, MD 11/17/19 [phone]

## 2019-11-10 NOTE — ED Triage Notes (Addendum)
Pt reports that since Christmas he has passed out 3 times with the last episode yesterday. Denies any other associated symptoms. Pt states he did have ETOH around each episode, but it "wasn't that much because I am not a big drinker."

## 2019-11-16 ENCOUNTER — Encounter: Payer: Self-pay | Admitting: Cardiology

## 2019-12-01 ENCOUNTER — Ambulatory Visit: Payer: Self-pay | Admitting: Cardiology

## 2019-12-11 ENCOUNTER — Ambulatory Visit: Payer: Self-pay | Attending: Internal Medicine

## 2019-12-11 DIAGNOSIS — Z20822 Contact with and (suspected) exposure to covid-19: Secondary | ICD-10-CM | POA: Insufficient documentation

## 2019-12-13 LAB — NOVEL CORONAVIRUS, NAA: SARS-CoV-2, NAA: NOT DETECTED

## 2020-02-29 ENCOUNTER — Ambulatory Visit: Payer: Self-pay | Attending: Internal Medicine

## 2020-02-29 DIAGNOSIS — Z20822 Contact with and (suspected) exposure to covid-19: Secondary | ICD-10-CM

## 2020-03-02 LAB — SARS-COV-2, NAA 2 DAY TAT

## 2020-03-02 LAB — NOVEL CORONAVIRUS, NAA: SARS-CoV-2, NAA: NOT DETECTED

## 2020-08-24 ENCOUNTER — Emergency Department (HOSPITAL_BASED_OUTPATIENT_CLINIC_OR_DEPARTMENT_OTHER)
Admission: EM | Admit: 2020-08-24 | Discharge: 2020-08-25 | Disposition: A | Discharge status: Home / Self Care | Payer: 59 | Attending: Emergency Medicine | Admitting: Emergency Medicine

## 2020-08-24 ENCOUNTER — Other Ambulatory Visit: Payer: Self-pay

## 2020-08-24 ENCOUNTER — Encounter (HOSPITAL_BASED_OUTPATIENT_CLINIC_OR_DEPARTMENT_OTHER): Payer: Self-pay

## 2020-08-24 DIAGNOSIS — M545 Low back pain, unspecified: Secondary | ICD-10-CM | POA: Insufficient documentation

## 2020-08-24 DIAGNOSIS — Z5321 Procedure and treatment not carried out due to patient leaving prior to being seen by health care provider: Secondary | ICD-10-CM | POA: Diagnosis not present

## 2020-08-24 NOTE — ED Triage Notes (Signed)
Pt c/o mid/lower back pain x 2 weeks-started while moving a dryer-NAD-steady gait

## 2021-06-20 ENCOUNTER — Encounter (HOSPITAL_BASED_OUTPATIENT_CLINIC_OR_DEPARTMENT_OTHER): Payer: Self-pay | Admitting: *Deleted

## 2021-06-20 ENCOUNTER — Emergency Department (HOSPITAL_BASED_OUTPATIENT_CLINIC_OR_DEPARTMENT_OTHER): Payer: Self-pay

## 2021-06-20 ENCOUNTER — Other Ambulatory Visit: Payer: Self-pay

## 2021-06-20 ENCOUNTER — Emergency Department (HOSPITAL_BASED_OUTPATIENT_CLINIC_OR_DEPARTMENT_OTHER)
Admission: EM | Admit: 2021-06-20 | Discharge: 2021-06-20 | Disposition: A | Discharge status: Home / Self Care | Payer: Self-pay | Attending: Emergency Medicine | Admitting: Emergency Medicine

## 2021-06-20 DIAGNOSIS — J45909 Unspecified asthma, uncomplicated: Secondary | ICD-10-CM | POA: Insufficient documentation

## 2021-06-20 DIAGNOSIS — Z20822 Contact with and (suspected) exposure to covid-19: Secondary | ICD-10-CM | POA: Insufficient documentation

## 2021-06-20 DIAGNOSIS — F17210 Nicotine dependence, cigarettes, uncomplicated: Secondary | ICD-10-CM | POA: Insufficient documentation

## 2021-06-20 DIAGNOSIS — G43909 Migraine, unspecified, not intractable, without status migrainosus: Secondary | ICD-10-CM | POA: Insufficient documentation

## 2021-06-20 LAB — LIPASE, BLOOD: Lipase: 23 U/L (ref 11–51)

## 2021-06-20 LAB — CBC WITH DIFFERENTIAL/PLATELET
Abs Immature Granulocytes: 0.01 10*3/uL (ref 0.00–0.07)
Basophils Absolute: 0.1 10*3/uL (ref 0.0–0.1)
Basophils Relative: 1 %
Eosinophils Absolute: 0.3 10*3/uL (ref 0.0–0.5)
Eosinophils Relative: 6 %
HCT: 43.3 % (ref 39.0–52.0)
Hemoglobin: 14.6 g/dL (ref 13.0–17.0)
Immature Granulocytes: 0 %
Lymphocytes Relative: 29 %
Lymphs Abs: 1.6 10*3/uL (ref 0.7–4.0)
MCH: 31.8 pg (ref 26.0–34.0)
MCHC: 33.7 g/dL (ref 30.0–36.0)
MCV: 94.3 fL (ref 80.0–100.0)
Monocytes Absolute: 0.6 10*3/uL (ref 0.1–1.0)
Monocytes Relative: 12 %
Neutro Abs: 2.8 10*3/uL (ref 1.7–7.7)
Neutrophils Relative %: 52 %
Platelets: 328 10*3/uL (ref 150–400)
RBC: 4.59 MIL/uL (ref 4.22–5.81)
RDW: 13.3 % (ref 11.5–15.5)
WBC: 5.4 10*3/uL (ref 4.0–10.5)
nRBC: 0 % (ref 0.0–0.2)

## 2021-06-20 LAB — COMPREHENSIVE METABOLIC PANEL
ALT: 16 U/L (ref 0–44)
AST: 20 U/L (ref 15–41)
Albumin: 4.3 g/dL (ref 3.5–5.0)
Alkaline Phosphatase: 56 U/L (ref 38–126)
Anion gap: 8 (ref 5–15)
BUN: 7 mg/dL (ref 6–20)
CO2: 27 mmol/L (ref 22–32)
Calcium: 9.1 mg/dL (ref 8.9–10.3)
Chloride: 105 mmol/L (ref 98–111)
Creatinine, Ser: 0.88 mg/dL (ref 0.61–1.24)
GFR, Estimated: >60 mL/min (ref >60)
Glucose, Bld: 103 mg/dL — ABNORMAL HIGH (ref 70–99)
Potassium: 3.8 mmol/L (ref 3.5–5.1)
Sodium: 140 mmol/L (ref 135–145)
Total Bilirubin: 0.3 mg/dL (ref 0.3–1.2)
Total Protein: 7.3 g/dL (ref 6.5–8.1)

## 2021-06-20 LAB — RESP PANEL BY RT-PCR (FLU A&B, COVID) ARPGX2
Influenza A by PCR: NEGATIVE
Influenza B by PCR: NEGATIVE
SARS Coronavirus 2 by RT PCR: NEGATIVE

## 2021-06-20 MED ORDER — PROCHLORPERAZINE EDISYLATE 10 MG/2ML IJ SOLN
10.0000 mg | Freq: Once | INTRAMUSCULAR | Status: AC
  Administered 2021-06-20: 10 mg via INTRAVENOUS
  Filled 2021-06-20: qty 2

## 2021-06-20 MED ORDER — MORPHINE SULFATE (PF) 4 MG/ML IV SOLN
4.0000 mg | Freq: Once | INTRAVENOUS | Status: AC
  Administered 2021-06-20: 4 mg via INTRAVENOUS
  Filled 2021-06-20: qty 1

## 2021-06-20 MED ORDER — SODIUM CHLORIDE 0.9 % IV BOLUS
1000.0000 mL | Freq: Once | INTRAVENOUS | Status: AC
  Administered 2021-06-20: 1000 mL via INTRAVENOUS

## 2021-06-20 MED ORDER — DIPHENHYDRAMINE HCL 50 MG/ML IJ SOLN
12.5000 mg | Freq: Once | INTRAMUSCULAR | Status: AC
  Administered 2021-06-20: 12.5 mg via INTRAVENOUS
  Filled 2021-06-20: qty 1

## 2021-06-20 MED ORDER — ONDANSETRON HCL 4 MG/2ML IJ SOLN
4.0000 mg | Freq: Once | INTRAMUSCULAR | Status: AC
  Administered 2021-06-20: 4 mg via INTRAVENOUS
  Filled 2021-06-20: qty 2

## 2021-06-20 NOTE — ED Provider Notes (Signed)
[facility] EMERGENCY DEPARTMENT Provider Note   CSN: [id number] Arrival date & time: 06/20/21  1853     History Chief Complaint  Patient presents with   Headache   Diarrhea   Emesis    Jerry Acosta is a 25 y.o. male with a pmh of migraines presenting today with a complaint of headaches, NVD and occasional chills.  Patient states that last Saturday he began to feel a headache in the back of his head.  York Spaniel that it  felt mostly like his normal migraines however over the weekend progressed and on Sunday he became nauseous and began vomiting.  Occasional episodes of diarrhea.  No hematemesis, melena or hematochezia.  He says that the headache is made worse when extending his neck. No neck stiffness.  Mild photophobia.  No sick contacts or recent travel.  He did not attempt to take anything at home.  Denies any falls or trauma.  No history of malignancy.     Past Medical History:  Diagnosis Date   Asthma     There are no problems to display for this patient.   History reviewed. No pertinent surgical history.     No family history on file.  Social History   Tobacco Use   Smoking status: Every Day    Packs/day: 0.50    Types: Cigarettes   Smokeless tobacco: Never  Vaping Use   Vaping Use: Never used  Substance Use Topics   Alcohol use: Yes    Comment: occ   Drug use: Yes    Types: Marijuana    Home Medications Prior to Admission medications   Medication Sig Start Date End Date Taking? Authorizing Provider  azithromycin (ZITHROMAX) 250 MG tablet Take 1 tablet (250 mg total) by mouth daily. 05/28/17   Arby Barrette, MD  benzonatate (TESSALON) 100 MG capsule Take 1 capsule (100 mg total) by mouth every 8 (eight) hours. 11/07/18   Palumbo, April, MD  ibuprofen (ADVIL,MOTRIN) 800 MG tablet Take 1 tablet (800 mg total) by mouth 3 (three) times daily. 05/28/17   Arby Barrette, MD  ondansetron (ZOFRAN ODT) 4 MG disintegrating tablet Take 1 tablet (4 mg total) by  mouth every 4 (four) hours as needed for nausea or vomiting. 09/21/18   Charlynne Pander, MD  promethazine (PHENERGAN) 25 MG tablet Take 1 tablet (25 mg total) by mouth every 6 (six) hours as needed for nausea or vomiting. 04/14/16   Linwood Dibbles, MD    Allergies    Patient has no known allergies.  Review of Systems   Review of Systems  Constitutional:  Positive for chills and fever. Negative for fatigue.  HENT:  Negative for tinnitus and trouble swallowing.   Eyes:  Positive for photophobia. Negative for pain.  Respiratory:  Negative for chest tightness and shortness of breath.   Cardiovascular:  Negative for chest pain.  Gastrointestinal:  Positive for diarrhea, nausea and vomiting.  Musculoskeletal:  Negative for back pain, neck pain and neck stiffness.  Skin:  Negative for rash and wound.  Neurological:  Positive for headaches. Negative for dizziness, weakness and light-headedness.  Psychiatric/Behavioral:  Negative for confusion.   All other systems reviewed and are negative.  Physical Exam Updated Vital Signs BP (!) 157/96 (BP Location: Right Arm)   Pulse (!) 54   Temp 98.3 F (36.8 C) (Oral)   Resp 18   Ht 5\' 6"  (1.676 m)   Wt 63.5 kg   SpO2 100%   BMI 22.60 kg/m  Physical Exam Vitals and nursing note reviewed.  Constitutional:      Appearance: Normal appearance. He is well-developed.     Comments: Patient vomiting upon evaluation  HENT:     Head: Normocephalic and atraumatic.  Eyes:     General: No scleral icterus.    Extraocular Movements: Extraocular movements intact.     Conjunctiva/sclera: Conjunctivae normal.     Pupils: Pupils are equal, round, and reactive to light.  Neck:     Meningeal: Brudzinski's sign absent.  Cardiovascular:     Rate and Rhythm: Normal rate and regular rhythm.     Heart sounds: No murmur heard. Pulmonary:     Effort: Pulmonary effort is normal. No respiratory distress.  Abdominal:     General: Bowel sounds are normal.      Palpations: Abdomen is soft.     Tenderness: There is no abdominal tenderness.  Musculoskeletal:     Cervical back: Normal range of motion and neck supple. No rigidity.  Skin:    General: Skin is warm and dry.     Findings: No rash.  Neurological:     Mental Status: He is alert.  Psychiatric:        Mood and Affect: Mood normal.        Behavior: Behavior normal.    ED Results / Procedures / Treatments   Labs (all labs ordered are listed, but only abnormal results are displayed) Labs Reviewed  COMPREHENSIVE METABOLIC PANEL - Abnormal; Notable for the following components:      Result Value   Glucose, Bld 103 (*)    All other components within normal limits  RESP PANEL BY RT-PCR (FLU A&B, COVID) ARPGX2  CBC WITH DIFFERENTIAL/PLATELET  LIPASE, BLOOD    EKG None  Radiology CT HEAD WO CONTRAST ( )  Result Date: 06/20/2021 CLINICAL DATA:  Headache, new or worsening, positional (Age 43-49y) EXAM: CT HEAD WITHOUT CONTRAST TECHNIQUE: Contiguous axial images were obtained from the base of the skull through the vertex without intravenous contrast. COMPARISON:  None. FINDINGS: Brain: Normal anatomic configuration. No abnormal intra or extra-axial mass lesion or fluid collection. No abnormal mass effect or midline shift. No evidence of acute intracranial hemorrhage or infarct. Ventricular size is normal. Cerebellum unremarkable. Vascular: Unremarkable Skull: Intact Sinuses/Orbits: Small air-fluid levels are noted within the visualized maxillary sinuses bilaterally nasal thickening within the frontal sinuses bilaterally. Remaining paranasal sinuses are clear. Orbits are unremarkable. Other: Mastoid air cells and middle ear cavities are clear. IMPRESSION: No acute intracranial abnormality. Bilateral paranasal sinus disease, in keeping with acute sinusitis in the appropriate clinical setting. Electronically Signed   By: Helyn Numbers M.D.   On: 06/20/2021 20:27    Procedures Procedures    Medications Ordered in ED Medications - No data to display  ED Course  I have reviewed the triage vital signs and the nursing notes. Case discussed with MD Schlossman.  Pertinent labs & imaging results that were available during my care of the patient were reviewed by me and considered in my medical decision making (see chart for details).    MDM Rules/Calculators/A&P with a pmh of migraines presenting today with a complaint of headaches, NVD and occasional chills.  Patient states that last Saturday he began to feel a headache in the back of his head.  York Spaniel that it  felt mostly like his normal migraines however over the weekend progressed and on Sunday he became nauseous and began vomiting.   Although I believe this to be a  likely manifestation of migraine, however due to his abnormal presentation I felt as though I needed to rule out intracranial mass or hemorrhage.  I obtained a head CT which revealed no intracranial abnormalities, however noted that the patient may have acute sinusitis.  Patient remained afebrile without antipyretics today in the ER.  This in conjunction with his normal ROM of his neck and negative meningeal tests, deterred me from pursuing a lumbar puncture served more risk than benefit.  Patient continued with nausea and vomiting throughout his stay however because he was not driving I felt it appropriate to administer a migraine cocktail.  Patient responded very well to this.  When I reentered the room to evaluate him he said that "everything was fixed I feel completely normal."  I again performed a neuro exam and watched the patient ambulate to the bathroom.  Patient did so without difficulty.  Blood work was unremarkable.  Patient called a ride and they are in route.  Patient requesting to go home.  I feel patient stable to discharge with strict return precautions.  We discussed at length the red flags of a headache and patient voiced understanding.  He will follow-up with a  primary care provider about his migraines.  Number on discharge papers circled for PCP establishment.  Final Clinical Impression(s) / ED Diagnoses Final diagnoses:  Migraine without status migrainosus, not intractable, unspecified migraine type    Rx / DC Orders Results and diagnoses were explained to the patient. Return precautions discussed in full. Patient had no additional questions and expressed complete understanding.     Woodroe Chen 06/21/21 1304    Alvira Monday, MD 06/21/21 2244

## 2021-06-20 NOTE — ED Triage Notes (Signed)
Headache on and off for a week. Vomiting and diarrhea.

## 2021-06-20 NOTE — ED Notes (Signed)
Patient transported to CT 

## 2022-03-23 IMAGING — CT CT HEAD W/O CM
3 series · 15 of 47 positions shown, 18 images · non-contrast
Comparison: None.

CLINICAL DATA: Headache, new or worsening, positional (Age 19-49y)

EXAM:
CT HEAD WITHOUT CONTRAST
TECHNIQUE: Contiguous axial images were obtained from the base of the skull
through the vertex without intravenous contrast.

[Series 2: head wo · axial · 0.42mm/px · z∈[+1148,+1284]mm · 9 of 33 slices shown, 12 images]
[im 3/33  brain]
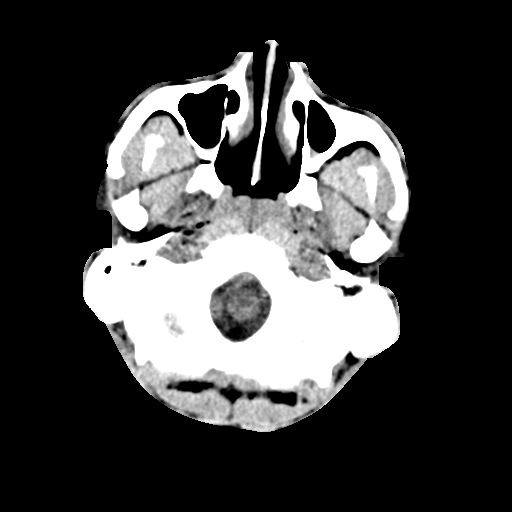
[im 3/33  bone]
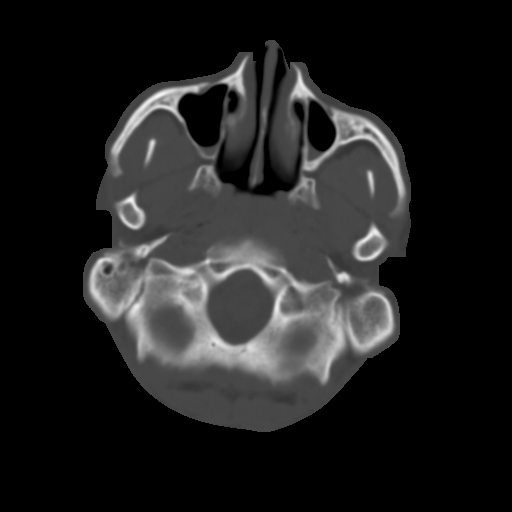
[im 6/33  brain]
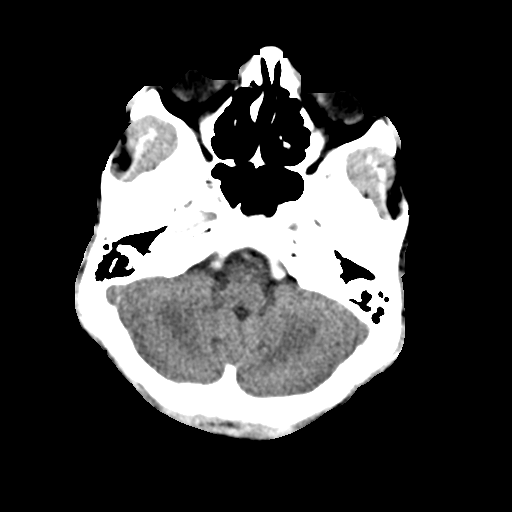
[im 9/33  brain]
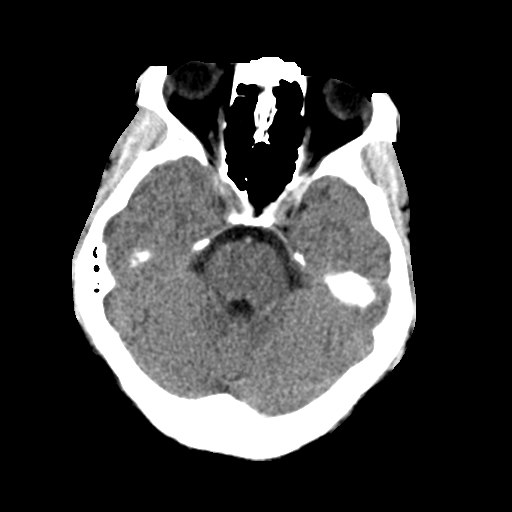
[im 13/33  brain]
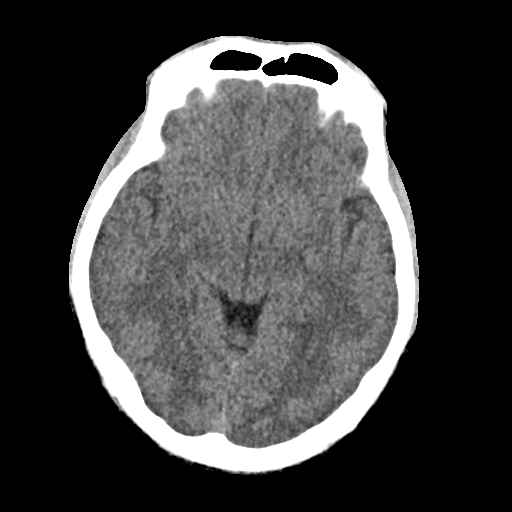
[im 17/33  brain]
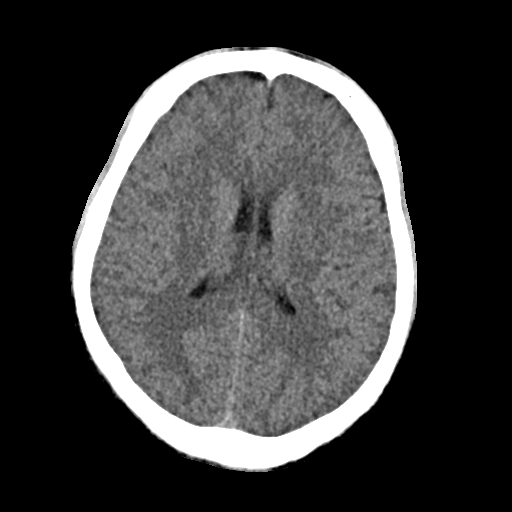
[im 17/33  bone]
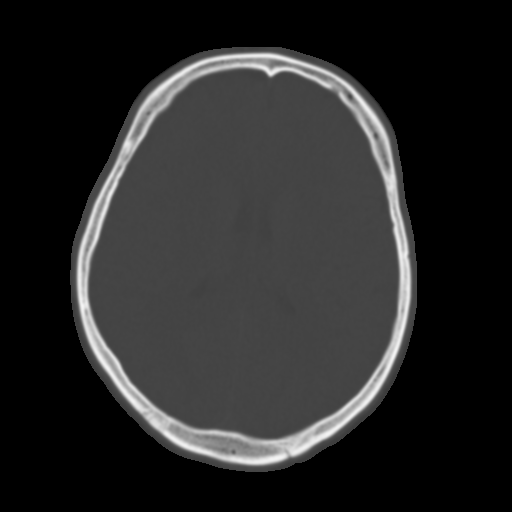
[im 20/33  brain]
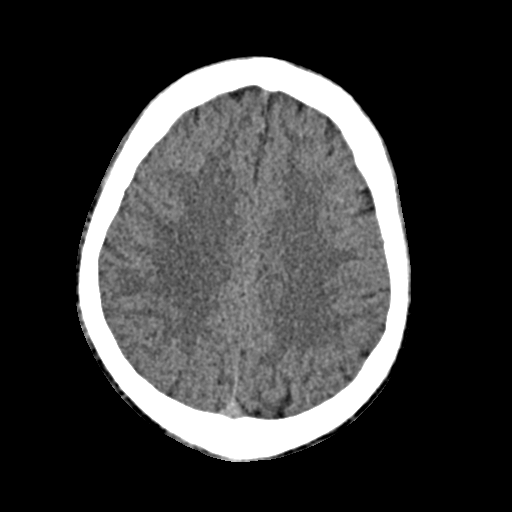
[im 24/33  brain]
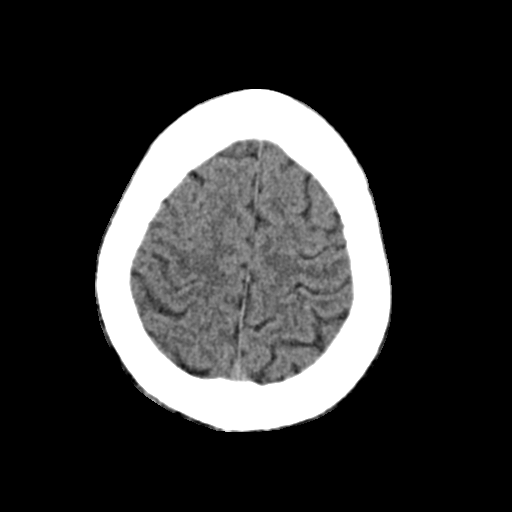
[im 27/33  brain]
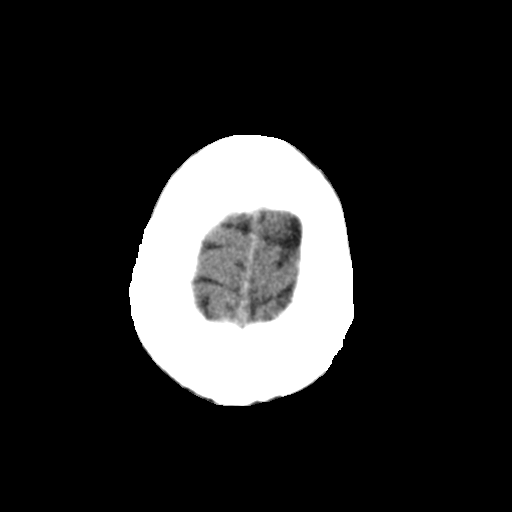
[im 30/33  brain]
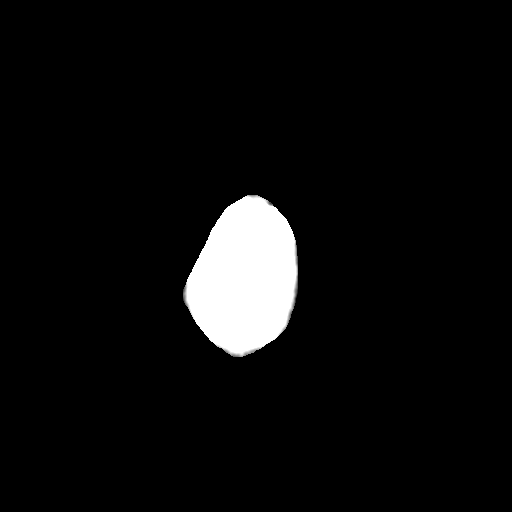
[im 30/33  bone]
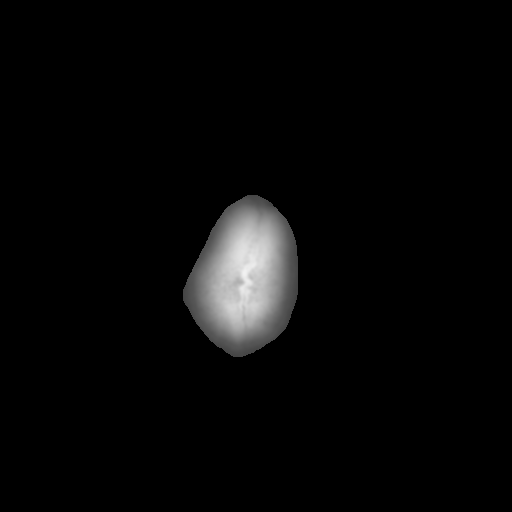

[Series 4: coronal soft · coronal · 0.31mm/px · 3 of 67 slices shown]
[im 23/67  brain]
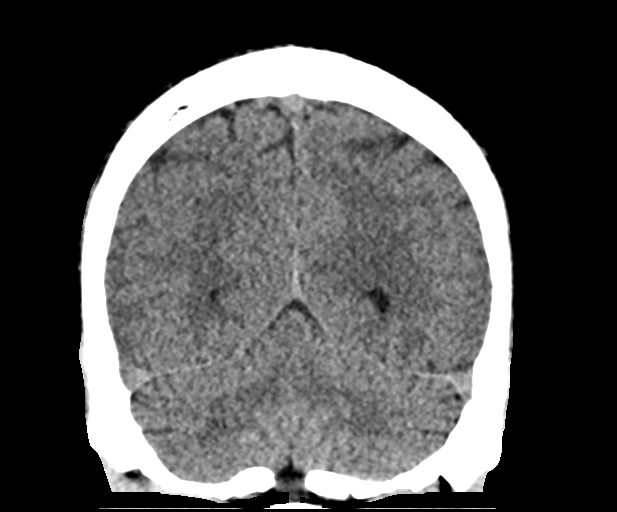
[im 30/67  brain]
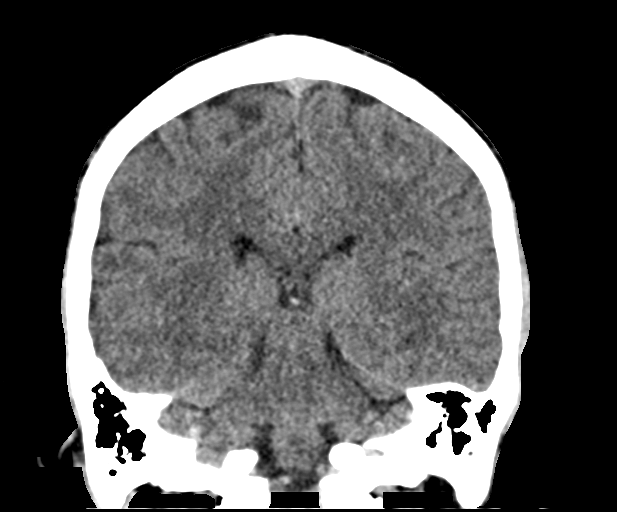
[im 37/67  brain]
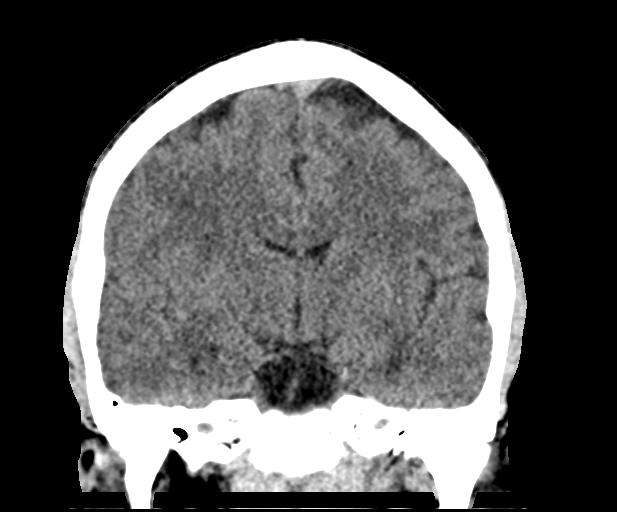

[Series 5: sag soft · sagittal · 0.31mm/px · 3 of 67 slices shown]
[im 23/67  brain]
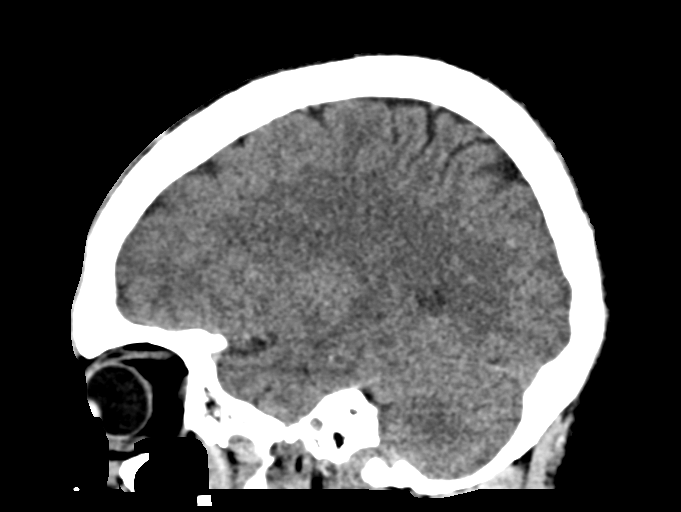
[im 34/67  brain]
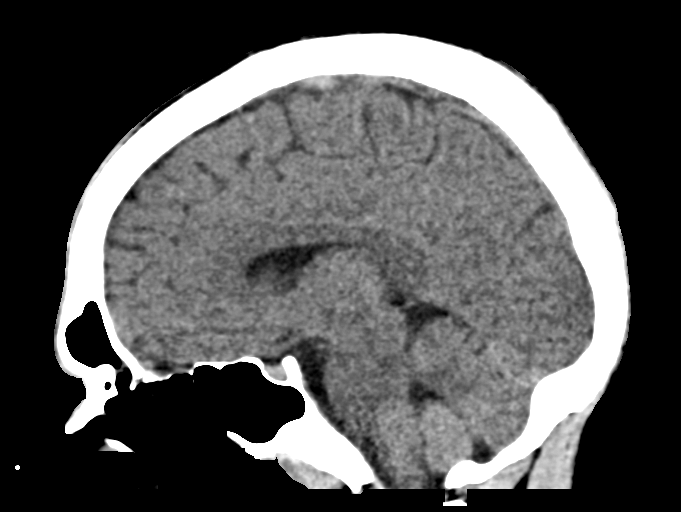
[im 45/67  brain]
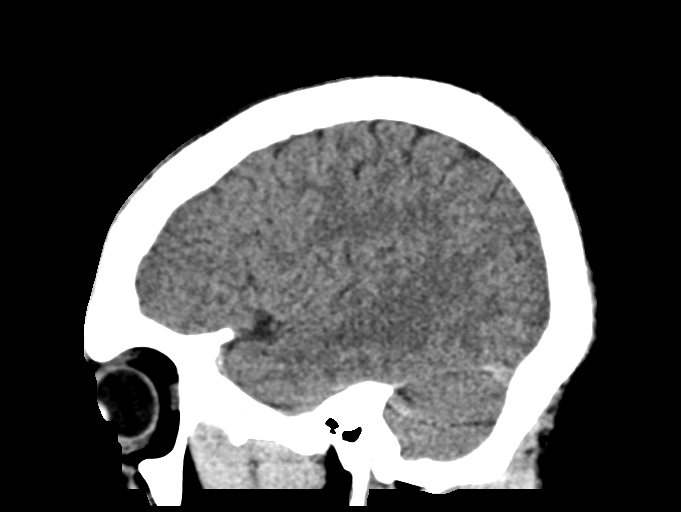

[15 of 47 positions shown; findings below may reference images not displayed]

FINDINGS: Brain: Normal anatomic configuration. No abnormal intra or
extra-axial mass lesion or fluid collection. No abnormal mass effect
or midline shift. No evidence of acute intracranial hemorrhage or
infarct. Ventricular size is normal. Cerebellum unremarkable.

Vascular: Unremarkable

Skull: Intact

Sinuses/Orbits: Small air-fluid levels are noted within the
visualized maxillary sinuses bilaterally nasal thickening within the
frontal sinuses bilaterally. Remaining paranasal sinuses are clear.
Orbits are unremarkable.

Other: Mastoid air cells and middle ear cavities are clear.
IMPRESSION: No acute intracranial abnormality.

Bilateral paranasal sinus disease, in keeping with acute sinusitis
in the appropriate clinical setting.

## 2023-08-06 ENCOUNTER — Other Ambulatory Visit: Payer: Self-pay

## 2023-08-06 DIAGNOSIS — M545 Low back pain, unspecified: Secondary | ICD-10-CM | POA: Diagnosis present

## 2023-08-06 DIAGNOSIS — Z202 Contact with and (suspected) exposure to infections with a predominantly sexual mode of transmission: Secondary | ICD-10-CM | POA: Diagnosis not present

## 2023-08-06 LAB — URINALYSIS, W/ REFLEX TO CULTURE (INFECTION SUSPECTED)
Bilirubin Urine: NEGATIVE
Glucose, UA: NEGATIVE mg/dL
Hgb urine dipstick: NEGATIVE
Ketones, ur: NEGATIVE mg/dL
Leukocytes,Ua: NEGATIVE
Nitrite: NEGATIVE
Protein, ur: NEGATIVE mg/dL
Specific Gravity, Urine: >=1.03 (ref 1.005–1.030)
pH: 7 (ref 5.0–8.0)

## 2023-08-06 LAB — BASIC METABOLIC PANEL
Anion gap: 10 (ref 5–15)
BUN: 10 mg/dL (ref 6–20)
CO2: 25 mmol/L (ref 22–32)
Calcium: 9.3 mg/dL (ref 8.9–10.3)
Chloride: 101 mmol/L (ref 98–111)
Creatinine, Ser: 0.92 mg/dL (ref 0.61–1.24)
GFR, Estimated: >60 mL/min (ref >60)
Glucose, Bld: 95 mg/dL (ref 70–99)
Potassium: 3.5 mmol/L (ref 3.5–5.1)
Sodium: 136 mmol/L (ref 135–145)

## 2023-08-06 LAB — CBC
HCT: 41.8 % (ref 39.0–52.0)
Hemoglobin: 14.5 g/dL (ref 13.0–17.0)
MCH: 32.3 pg (ref 26.0–34.0)
MCHC: 34.7 g/dL (ref 30.0–36.0)
MCV: 93.1 fL (ref 80.0–100.0)
Platelets: 312 10*3/uL (ref 150–400)
RBC: 4.49 MIL/uL (ref 4.22–5.81)
RDW: 12.9 % (ref 11.5–15.5)
WBC: 7 10*3/uL (ref 4.0–10.5)
nRBC: 0 % (ref 0.0–0.2)

## 2023-08-06 NOTE — ED Triage Notes (Signed)
Patient reports back pain since last Thursday after changing a tires. Also reports some testicular pain since Thursday with some burning upon voiding. Patient is able to ambulate to the room without difficulty.

## 2023-08-07 ENCOUNTER — Emergency Department (HOSPITAL_BASED_OUTPATIENT_CLINIC_OR_DEPARTMENT_OTHER)
Admission: EM | Admit: 2023-08-07 | Discharge: 2023-08-07 | Disposition: A | Discharge status: Home / Self Care | Payer: 59 | Attending: Emergency Medicine | Admitting: Emergency Medicine

## 2023-08-07 DIAGNOSIS — Z202 Contact with and (suspected) exposure to infections with a predominantly sexual mode of transmission: Secondary | ICD-10-CM

## 2023-08-07 DIAGNOSIS — M545 Low back pain, unspecified: Secondary | ICD-10-CM

## 2023-08-07 MED ORDER — NAPROXEN 500 MG PO TABS: 500.0000 mg | ORAL_TABLET | Freq: Two times a day (BID) | ORAL | 0 refills | Status: AC

## 2023-08-07 MED ORDER — NAPROXEN 250 MG PO TABS
500.0000 mg | ORAL_TABLET | Freq: Once | ORAL | Status: AC
  Administered 2023-08-07: 500 mg via ORAL
  Filled 2023-08-07: qty 2

## 2023-08-07 MED ORDER — CEFTRIAXONE SODIUM 500 MG IJ SOLR
500.0000 mg | Freq: Once | INTRAMUSCULAR | Status: AC
  Administered 2023-08-07: 500 mg via INTRAMUSCULAR
  Filled 2023-08-07: qty 500

## 2023-08-07 MED ORDER — METHOCARBAMOL 500 MG PO TABS: 500.0000 mg | ORAL_TABLET | Freq: Two times a day (BID) | ORAL | 0 refills | Status: AC

## 2023-08-07 MED ORDER — LIDOCAINE HCL (PF) 1 % IJ SOLN
INTRAMUSCULAR | Status: AC
  Administered 2023-08-07: 1 mL
  Filled 2023-08-07: qty 5

## 2023-08-07 MED ORDER — DOXYCYCLINE HYCLATE 100 MG PO CAPS: 100.0000 mg | ORAL_CAPSULE | Freq: Two times a day (BID) | ORAL | 0 refills | Status: AC

## 2023-08-07 MED ORDER — DOXYCYCLINE HYCLATE 100 MG PO TABS
100.0000 mg | ORAL_TABLET | Freq: Once | ORAL | Status: AC
  Administered 2023-08-07: 100 mg via ORAL
  Filled 2023-08-07: qty 1

## 2023-08-07 NOTE — ED Provider Notes (Signed)
[facility]  Provider Note  CSN: [id number] Arrival date & time: 08/06/23 2236  History Chief Complaint  Patient presents with   Back Pain    Oluwaseyi Raffel is a 27 y.o. male here for R lower back pain started about 5 days ago while changing a tire. Worse with movement. Non-radiating.   He has a separate complaint of burning when he urinates and concern for STI. He has had some testicular pain off and on for several years but that is not an acute concern tonight. No fevers.    Home Medications Prior to Admission medications   Medication Sig Start Date End Date Taking? Authorizing Provider  doxycycline (VIBRAMYCIN) 100 MG capsule Take 1 capsule (100 mg total) by mouth 2 (two) times daily. 08/07/23  Yes Pollyann Savoy, MD  methocarbamol (ROBAXIN) 500 MG tablet Take 1 tablet (500 mg total) by mouth 2 (two) times daily. 08/07/23  Yes Pollyann Savoy, MD  naproxen (NAPROSYN) 500 MG tablet Take 1 tablet (500 mg total) by mouth 2 (two) times daily. 08/07/23  Yes Pollyann Savoy, MD     Allergies    Patient has no known allergies.   Review of Systems   Review of Systems Please see HPI for pertinent positives and negatives  Physical Exam BP 115/75 (BP Location: Left Arm)   Pulse 65   Temp 97.7 F (36.5 C)   Resp 18   Ht 5\' 6"  (1.676 m)   Wt 63.5 kg   SpO2 99%   BMI 22.60 kg/m   Physical Exam Vitals and nursing note reviewed.  Constitutional:      Appearance: Normal appearance.  HENT:     Head: Normocephalic and atraumatic.     Nose: Nose normal.     Mouth/Throat:     Mouth: Mucous membranes are moist.  Eyes:     Extraocular Movements: Extraocular movements intact.     Conjunctiva/sclera: Conjunctivae normal.  Cardiovascular:     Rate and Rhythm: Normal rate.  Pulmonary:     Effort: Pulmonary effort is normal.     Breath sounds: Normal breath sounds.  Abdominal:     General: Abdomen is flat.     Palpations:  Abdomen is soft.     Tenderness: There is no abdominal tenderness.  Genitourinary:    Penis: Normal.      Testes: Normal.  Musculoskeletal:        General: Tenderness (R lumbar paraspinal muscles) present. No swelling. Normal range of motion.     Cervical back: Neck supple.  Skin:    General: Skin is warm and dry.  Neurological:     General: No focal deficit present.     Mental Status: He is alert.  Psychiatric:        Mood and Affect: Mood normal.     ED Results / Procedures / Treatments   EKG None  Procedures Procedures  Medications Ordered in the ED Medications  cefTRIAXone (ROCEPHIN) injection 500 mg (has no administration in time range)  doxycycline (VIBRA-TABS) tablet 100 mg (has no administration in time range)  naproxen (NAPROSYN) tablet 500 mg (has no administration in time range)    Initial Impression and Plan  Patient here with two unrelated complaints. For his low back pain, this is MSK. Will treat with Naprosyn, Robaxin, recommend rest and heat. For his dysuria/concern for STI, labs done in triage show normal CBC, BMP and UA. Will add GC/CT. Patient would like  empiric treatment pending that result.   ED Course       MDM Rules/Calculators/A&P Medical Decision Making Problems Addressed: Acute right-sided low back pain without sciatica: acute illness or injury Possible exposure to STI: acute illness or injury  Amount and/or Complexity of Data Reviewed Labs: ordered. Decision-making details documented in ED Course.  Risk Prescription drug management.     Final Clinical Impression(s) / ED Diagnoses Final diagnoses:  Acute right-sided low back pain without sciatica  Possible exposure to STI    Rx / DC Orders ED Discharge Orders          Ordered    doxycycline (VIBRAMYCIN) 100 MG capsule  2 times daily        08/07/23 0159    naproxen (NAPROSYN) 500 MG tablet  2 times daily        08/07/23 0159    methocarbamol (ROBAXIN) 500 MG tablet  2  times daily        08/07/23 0159             Pollyann Savoy, MD 08/07/23 [phone]

## 2023-08-08 LAB — GC/CHLAMYDIA PROBE AMP (~~LOC~~) NOT AT ARMC
Chlamydia: NEGATIVE
Comment: NEGATIVE
Comment: NORMAL
Neisseria Gonorrhea: NEGATIVE

## 2023-10-16 ENCOUNTER — Other Ambulatory Visit: Payer: Self-pay

## 2023-10-16 ENCOUNTER — Emergency Department (HOSPITAL_BASED_OUTPATIENT_CLINIC_OR_DEPARTMENT_OTHER): Payer: Self-pay

## 2023-10-16 ENCOUNTER — Emergency Department (HOSPITAL_BASED_OUTPATIENT_CLINIC_OR_DEPARTMENT_OTHER)
Admission: EM | Admit: 2023-10-16 | Discharge: 2023-10-16 | Disposition: A | Discharge status: Home / Self Care | Payer: Self-pay | Attending: Emergency Medicine | Admitting: Emergency Medicine

## 2023-10-16 ENCOUNTER — Encounter (HOSPITAL_BASED_OUTPATIENT_CLINIC_OR_DEPARTMENT_OTHER): Payer: Self-pay | Admitting: Emergency Medicine

## 2023-10-16 ENCOUNTER — Other Ambulatory Visit (HOSPITAL_BASED_OUTPATIENT_CLINIC_OR_DEPARTMENT_OTHER): Payer: Self-pay

## 2023-10-16 DIAGNOSIS — F17210 Nicotine dependence, cigarettes, uncomplicated: Secondary | ICD-10-CM | POA: Insufficient documentation

## 2023-10-16 DIAGNOSIS — U071 COVID-19: Secondary | ICD-10-CM | POA: Insufficient documentation

## 2023-10-16 DIAGNOSIS — J45909 Unspecified asthma, uncomplicated: Secondary | ICD-10-CM | POA: Insufficient documentation

## 2023-10-16 LAB — RESP PANEL BY RT-PCR (RSV, FLU A&B, COVID)  RVPGX2
Influenza A by PCR: NEGATIVE
Influenza B by PCR: NEGATIVE
Resp Syncytial Virus by PCR: NEGATIVE
SARS Coronavirus 2 by RT PCR: POSITIVE — AB

## 2023-10-16 MED ORDER — GUAIFENESIN-CODEINE 100-10 MG/5ML PO SOLN
5.0000 mL | Freq: Three times a day (TID) | ORAL | 0 refills | Status: AC | PRN
Start: 2023-10-16 — End: ?
  Filled 2023-10-16: qty 120, 8d supply, fill #0

## 2023-10-16 MED ORDER — ACETAMINOPHEN 325 MG PO TABS
650.0000 mg | ORAL_TABLET | Freq: Four times a day (QID) | ORAL | 0 refills | Status: AC | PRN
  Filled 2023-10-16: qty 100, 13d supply, fill #0

## 2023-10-16 MED ORDER — IBUPROFEN 600 MG PO TABS
600.0000 mg | ORAL_TABLET | Freq: Four times a day (QID) | ORAL | 0 refills | Status: AC | PRN
  Filled 2023-10-16: qty 30, 8d supply, fill #0

## 2023-10-16 MED ORDER — KETOROLAC TROMETHAMINE 15 MG/ML IJ SOLN
15.0000 mg | Freq: Once | INTRAMUSCULAR | Status: AC
  Administered 2023-10-16: 15 mg via INTRAMUSCULAR
  Filled 2023-10-16: qty 1

## 2023-10-16 MED ORDER — OXYMETAZOLINE HCL 0.05 % NA SOLN
1.0000 | Freq: Two times a day (BID) | NASAL | 0 refills | Status: AC
  Filled 2023-10-16: qty 30, 90d supply, fill #0

## 2023-10-16 MED ORDER — KETOROLAC TROMETHAMINE 60 MG/2ML IM SOLN
15.0000 mg | Freq: Once | INTRAMUSCULAR | Status: DC
  Filled 2023-10-16: qty 2

## 2023-10-16 NOTE — ED Provider Notes (Addendum)
[place] [facility] Provider Note  CSN: [id number] Arrival date & time: 10/16/23 7829  Chief Complaint(s) Chills  HPI Jerry Acosta is a 27 y.o. male with past medical history as below, significant for childhood asthma who presents to the ED with complaint of URI symptoms  Onset of symptoms yesterday when leaving work, he had chills, cough, congestion, postnasal drip, sore throat.  He slept the rest today, took Tylenol and Aleve overnight which provided some relief of symptoms.  He presents to the ER secondary to ongoing symptoms.  He has no difficulty breathing, no nausea or vomiting.  No sick contacts recent travel.  Past Medical History Past Medical History:  Diagnosis Date   Asthma    There are no active problems to display for this patient.  Home Medication(s) Prior to Admission medications   Medication Sig Start Date End Date Taking? Authorizing Provider  acetaminophen (TYLENOL) 325 MG tablet Take 2 tablets (650 mg total) by mouth every 6 (six) hours as needed. 10/16/23  Yes Tanda Rockers A, DO  guaiFENesin-codeine 100-10 MG/5ML syrup Take 5 mLs by mouth 3 (three) times daily as needed for cough. 10/16/23  Yes Tanda Rockers A, DO  ibuprofen (ADVIL) 600 MG tablet Take 1 tablet (600 mg total) by mouth every 6 (six) hours as needed. 10/16/23  Yes Tanda Rockers A, DO  oxymetazoline (AFRIN NASAL SPRAY) 0.05 % nasal spray Place 1 spray into both nostrils 2 (two) times daily for 3 days. 10/16/23 11/15/23 Yes Sloan Leiter, DO  doxycycline (VIBRAMYCIN) 100 MG capsule Take 1 capsule (100 mg total) by mouth 2 (two) times daily. 08/07/23   Pollyann Savoy, MD  methocarbamol (ROBAXIN) 500 MG tablet Take 1 tablet (500 mg total) by mouth 2 (two) times daily. 08/07/23   Pollyann Savoy, MD  naproxen (NAPROSYN) 500 MG tablet Take 1 tablet (500 mg total) by mouth 2 (two) times daily. 08/07/23   Pollyann Savoy, MD                                                                                                                                     Past Surgical History History reviewed. No pertinent surgical history. Family History History reviewed. No pertinent family history.  Social History Social History   Tobacco Use   Smoking status: Every Day    Current packs/day: 0.50    Types: Cigarettes    Passive exposure: Current   Smokeless tobacco: Never  Vaping Use   Vaping status: Never Used  Substance Use Topics   Alcohol use: Yes    Comment: occ   Drug use: Yes    Types: Marijuana   Allergies Patient has no known allergies.  Review of Systems Review of Systems  Constitutional:  Positive for chills. Negative for fever.  HENT:  Positive for congestion, postnasal drip, rhinorrhea and sore throat.   Respiratory:  Positive for  cough. Negative for chest tightness and shortness of breath.   Cardiovascular:  Negative for chest pain and palpitations.  Gastrointestinal:  Negative for abdominal pain and nausea.  Genitourinary:  Negative for difficulty urinating.  Musculoskeletal:  Negative for arthralgias and neck stiffness.  Neurological:  Negative for syncope and weakness.  All other systems reviewed and are negative.   Physical Exam Vital Signs  I have reviewed the triage vital signs BP (!) 140/91 (BP Location: Right Arm)   Pulse 81   Temp 98.7 F (37.1 C) (Oral)   Resp 16   Ht 5\' 6"  (1.676 m)   Wt 63.5 kg   SpO2 99%   BMI 22.60 kg/m  Physical Exam Vitals and nursing note reviewed.  Constitutional:      General: He is not in acute distress.    Appearance: Normal appearance. He is well-developed.  HENT:     Head: Normocephalic and atraumatic.     Right Ear: External ear normal.     Left Ear: External ear normal.     Mouth/Throat:     Mouth: Mucous membranes are moist.     Pharynx: Posterior oropharyngeal erythema present. No oropharyngeal exudate or uvula swelling.     Comments: Mild posterior oropharynx  erythema Eyes:     General: No scleral icterus. Neck:     Comments: No meningismus Cardiovascular:     Rate and Rhythm: Normal rate and regular rhythm.     Pulses: Normal pulses.     Heart sounds: Normal heart sounds.  Pulmonary:     Effort: Pulmonary effort is normal. No respiratory distress.     Breath sounds: Normal breath sounds. No wheezing or rales.  Abdominal:     General: Abdomen is flat.     Palpations: Abdomen is soft.     Tenderness: There is no abdominal tenderness.  Musculoskeletal:     Cervical back: No rigidity.     Right lower leg: No edema.     Left lower leg: No edema.  Skin:    General: Skin is warm and dry.     Capillary Refill: Capillary refill takes less than 2 seconds.  Neurological:     Mental Status: He is alert and oriented to person, place, and time.     GCS: GCS eye subscore is 4. GCS verbal subscore is 5. GCS motor subscore is 6.     Cranial Nerves: No dysarthria or facial asymmetry.     Motor: No tremor.     Coordination: Coordination normal.     Gait: Gait is intact.  Psychiatric:        Mood and Affect: Mood normal.        Behavior: Behavior normal.     ED Results and Treatments Labs (all labs ordered are listed, but only abnormal results are displayed) Labs Reviewed  RESP PANEL BY RT-PCR (RSV, FLU A&B, COVID)  RVPGX2 - Abnormal; Notable for the following components:      Result Value   SARS Coronavirus 2 by RT PCR POSITIVE (*)    All other components within normal limits  Radiology DG Chest Portable 1 View Result Date: 10/16/2023 CLINICAL DATA:  Cough. EXAM: PORTABLE CHEST 1 VIEW COMPARISON:  05/28/2017. FINDINGS: Bilateral lung fields are clear. Bilateral costophrenic angles are clear. Normal cardio-mediastinal silhouette. No acute osseous abnormalities. The soft tissues are within normal limits. IMPRESSION: No  active disease. Electronically Signed   By: Jules Schick M.D.   On: 10/16/2023 08:22    Pertinent labs & imaging results that were available during my care of the patient were reviewed by me and considered in my medical decision making (see MDM for details).  Medications Ordered in ED Medications  ketorolac (TORADOL) 15 MG/ML injection 15 mg (15 mg Intramuscular Given 10/16/23 0817)                                                                                                                                     Procedures Procedures  (including critical care time)  Medical Decision Making / ED Course    Medical Decision Making:    Jeriel Hardwell is a 27 y.o. male with past medical history as below, significant for childhood asthma who presents to the ED with complaint of URI symptoms. The complaint involves an extensive differential diagnosis and also carries with it a high risk of complications and morbidity.  Serious etiology was considered. Ddx includes but is not limited to: URI, pneumonia, pharyngitis, COVID, etc.  Complete initial physical exam performed, notably the patient was in acute distress, no hypoxia, HDS.    Reviewed and confirmed nursing documentation for past medical history, family history, social history.  Vital signs reviewed.      Clinical Course as of 10/16/23 0827  Wed Oct 16, 2023  7829 On wet read x-ray appears without acute abnormalities [SG]    Clinical Course User Index [SG] Sloan Leiter, DO    Brief summary: Otherwise healthy 27 year old male here with URI symptoms over the past 24 hours.  Improvement with OTC Tylenol relief.  He has no dyspnea, no vomiting.  Well-appearing on exam.  Check viral swab, get x-ray.  He has no hypoxia.  He is stable for discharge with symptomatic treatment and close outpatient follow-up  >>covid +tive  The patient improved significantly and was discharged in stable condition. Detailed discussions were had with  the patient regarding current findings, and need for close f/u with PCP or on call doctor. The patient has been instructed to return immediately if the symptoms worsen in any way for re-evaluation. Patient verbalized understanding and is in agreement with current care plan. All questions answered prior to discharge.                  Additional history obtained: -Additional history obtained from na -External records from outside source obtained and reviewed including: Chart review including previous notes, labs, imaging, consultation notes including  Prior ED visits, home medications   Lab Tests: -I ordered, reviewed, and interpreted labs.  The pertinent results include:   Labs Reviewed  RESP PANEL BY RT-PCR (RSV, FLU A&B, COVID)  RVPGX2 - Abnormal; Notable for the following components:      Result Value   SARS Coronavirus 2 by RT PCR POSITIVE (*)    All other components within normal limits      EKG   EKG Interpretation Date/Time:    Ventricular Rate:    PR Interval:    QRS Duration:    QT Interval:    QTC Calculation:   R Axis:      Text Interpretation:           Imaging Studies ordered: I ordered imaging studies including CXR I independently visualized the following imaging with scope of interpretation limited to determining acute life threatening conditions related to emergency care; findings noted above I independently visualized and interpreted imaging. I agree with the radiologist interpretation   Medicines ordered and prescription drug management: Meds ordered this encounter  Medications   ibuprofen (ADVIL) 600 MG tablet    Sig: Take 1 tablet (600 mg total) by mouth every 6 (six) hours as needed.    Dispense:  30 tablet    Refill:  0   acetaminophen (TYLENOL) 325 MG tablet    Sig: Take 2 tablets (650 mg total) by mouth every 6 (six) hours as needed.    Dispense:  100 tablet    Refill:  0   oxymetazoline (AFRIN NASAL SPRAY) 0.05 % nasal  spray    Sig: Place 1 spray into both nostrils 2 (two) times daily for 3 days.    Dispense:  30 mL    Refill:  0   guaiFENesin-codeine 100-10 MG/5ML syrup    Sig: Take 5 mLs by mouth 3 (three) times daily as needed for cough.    Dispense:  120 mL    Refill:  0   DISCONTD: ketorolac (TORADOL) injection 15 mg   ketorolac (TORADOL) 15 MG/ML injection 15 mg    -I have reviewed the patients home medicines and have made adjustments as needed   Consultations Obtained:   Cardiac Monitoring: Continuous pulse oximetry interpreted by myself, 99% on RA.    Social Determinants of Health:  Diagnosis or treatment significantly limited by social determinants of health: current smoker no pcp   Reevaluation: After the interventions noted above, I reevaluated the patient and found that they have improved  Co morbidities that complicate the patient evaluation  Past Medical History:  Diagnosis Date   Asthma       Dispostion: Disposition decision including need for hospitalization was considered, and patient discharged from emergency department.    Final Clinical Impression(s) / ED Diagnoses Final diagnoses:  COVID-19        Sloan Leiter, DO 10/16/23 0757    Sloan Leiter, DO 10/16/23 2376

## 2023-10-16 NOTE — ED Triage Notes (Signed)
Has a cough and had chills since yesterday , slept all day, took a tylenol 4 am  and [employer]

## 2023-10-16 NOTE — Discharge Instructions (Addendum)
It was a pleasure caring for you today in the emergency department.  Be sure to drink plenty of fluids over the next few days, get plenty of rest.   Please return to the emergency department for any worsening or worrisome symptoms such as but not limited to inability to swallow, difficulty breathing, vomiting, etc.

## 2024-02-22 ENCOUNTER — Emergency Department (HOSPITAL_BASED_OUTPATIENT_CLINIC_OR_DEPARTMENT_OTHER): Payer: Self-pay

## 2024-02-22 ENCOUNTER — Encounter (HOSPITAL_BASED_OUTPATIENT_CLINIC_OR_DEPARTMENT_OTHER): Payer: Self-pay | Admitting: Emergency Medicine

## 2024-02-22 ENCOUNTER — Emergency Department (HOSPITAL_BASED_OUTPATIENT_CLINIC_OR_DEPARTMENT_OTHER)
Admission: EM | Admit: 2024-02-22 | Discharge: 2024-02-22 | Disposition: A | Discharge status: Home / Self Care | Payer: Self-pay | Attending: Emergency Medicine | Admitting: Emergency Medicine

## 2024-02-22 ENCOUNTER — Other Ambulatory Visit: Payer: Self-pay

## 2024-02-22 DIAGNOSIS — Z202 Contact with and (suspected) exposure to infections with a predominantly sexual mode of transmission: Secondary | ICD-10-CM | POA: Insufficient documentation

## 2024-02-22 DIAGNOSIS — R1084 Generalized abdominal pain: Secondary | ICD-10-CM | POA: Insufficient documentation

## 2024-02-22 DIAGNOSIS — R197 Diarrhea, unspecified: Secondary | ICD-10-CM | POA: Insufficient documentation

## 2024-02-22 LAB — CBC WITH DIFFERENTIAL/PLATELET
Abs Immature Granulocytes: 0.01 10*3/uL (ref 0.00–0.07)
Basophils Absolute: 0.1 10*3/uL (ref 0.0–0.1)
Basophils Relative: 1 %
Eosinophils Absolute: 0.3 10*3/uL (ref 0.0–0.5)
Eosinophils Relative: 4 %
HCT: 45.6 % (ref 39.0–52.0)
Hemoglobin: 15.4 g/dL (ref 13.0–17.0)
Immature Granulocytes: 0 %
Lymphocytes Relative: 36 %
Lymphs Abs: 2.2 10*3/uL (ref 0.7–4.0)
MCH: 31.4 pg (ref 26.0–34.0)
MCHC: 33.8 g/dL (ref 30.0–36.0)
MCV: 93.1 fL (ref 80.0–100.0)
Monocytes Absolute: 0.6 10*3/uL (ref 0.1–1.0)
Monocytes Relative: 9 %
Neutro Abs: 3 10*3/uL (ref 1.7–7.7)
Neutrophils Relative %: 50 %
Platelets: 360 10*3/uL (ref 150–400)
RBC: 4.9 MIL/uL (ref 4.22–5.81)
RDW: 13.4 % (ref 11.5–15.5)
WBC: 6.1 10*3/uL (ref 4.0–10.5)
nRBC: 0 % (ref 0.0–0.2)

## 2024-02-22 LAB — COMPREHENSIVE METABOLIC PANEL WITH GFR
ALT: 24 U/L (ref 0–44)
AST: 25 U/L (ref 15–41)
Albumin: 4.3 g/dL (ref 3.5–5.0)
Alkaline Phosphatase: 68 U/L (ref 38–126)
Anion gap: 12 (ref 5–15)
BUN: 8 mg/dL (ref 6–20)
CO2: 22 mmol/L (ref 22–32)
Calcium: 9.4 mg/dL (ref 8.9–10.3)
Chloride: 105 mmol/L (ref 98–111)
Creatinine, Ser: 0.9 mg/dL (ref 0.61–1.24)
GFR, Estimated: >60 mL/min (ref >60)
Glucose, Bld: 92 mg/dL (ref 70–99)
Potassium: 4.2 mmol/L (ref 3.5–5.1)
Sodium: 140 mmol/L (ref 135–145)
Total Bilirubin: 0.2 mg/dL (ref 0.0–1.2)
Total Protein: 6.6 g/dL (ref 6.5–8.1)

## 2024-02-22 LAB — LIPASE, BLOOD: Lipase: 29 U/L (ref 11–51)

## 2024-02-22 LAB — URINALYSIS, ROUTINE W REFLEX MICROSCOPIC
Bilirubin Urine: NEGATIVE
Glucose, UA: NEGATIVE mg/dL
Hgb urine dipstick: NEGATIVE
Ketones, ur: NEGATIVE mg/dL
Leukocytes,Ua: NEGATIVE
Nitrite: NEGATIVE
Protein, ur: NEGATIVE mg/dL
Specific Gravity, Urine: 1.025 (ref 1.005–1.030)
pH: 7 (ref 5.0–8.0)

## 2024-02-22 MED ORDER — AZITHROMYCIN 250 MG PO TABS
1000.0000 mg | ORAL_TABLET | Freq: Once | ORAL | Status: AC
  Administered 2024-02-22: 1000 mg via ORAL
  Filled 2024-02-22: qty 4

## 2024-02-22 MED ORDER — IOHEXOL 300 MG/ML  SOLN
75.0000 mL | Freq: Once | INTRAMUSCULAR | Status: AC | PRN
  Administered 2024-02-22: 75 mL via INTRAVENOUS

## 2024-02-22 MED ORDER — CEFTRIAXONE SODIUM 500 MG IJ SOLR
500.0000 mg | Freq: Once | INTRAMUSCULAR | Status: AC
  Administered 2024-02-22: 500 mg via INTRAMUSCULAR
  Filled 2024-02-22: qty 500

## 2024-02-22 NOTE — Discharge Instructions (Addendum)
 Today you were seen for abdominal pain and diarrhea.  Please take over-the-counter Imodium sparingly as needed for diarrhea.  Please make sure to drink plenty of fluids to avoid dehydration.  You have also been treated for gonorrhea and chlamydia as you have been exposed to these, please do not have sex until you have test of cure.  Thank you for letting us  treat you today. After reviewing your labs and imaging, I feel you are safe to go home. Please follow up with your PCP in the next several days and provide them with your records from this visit. Return to the Emergency Room if pain becomes severe or symptoms worsen.

## 2024-02-22 NOTE — ED Provider Notes (Cosign Needed Addendum)
 [facility] Provider Note   CSN: [id number] Arrival date & time: 02/22/24  1809     History  Chief Complaint  Patient presents with   Abdominal Pain    Jerry Acosta is a 28 y.o. male presents today for lower abdominal pain x 2 to 3 days.  Patient also endorses diarrhea.  Patient denies fever, chills, nausea, vomiting, chest pain, shortness of breath, or blood in stool.   Abdominal Pain Associated symptoms: diarrhea        Home Medications Prior to Admission medications   Medication Sig Start Date End Date Taking? Authorizing Provider  acetaminophen  (TYLENOL ) 325 MG tablet Take 2 tablets (650 mg total) by mouth every 6 (six) hours as needed. 10/16/23   Teddi Favors, DO  doxycycline  (VIBRAMYCIN ) 100 MG capsule Take 1 capsule (100 mg total) by mouth 2 (two) times daily. 08/07/23   Charmayne Cooper, MD  guaiFENesin -codeine  100-10 MG/5ML syrup Take 5 mLs by mouth 3 (three) times daily as needed for cough. 10/16/23   Teddi Favors, DO  ibuprofen  (ADVIL ) 600 MG tablet Take 1 tablet (600 mg total) by mouth every 6 (six) hours as needed. 10/16/23   Teddi Favors, DO  methocarbamol  (ROBAXIN ) 500 MG tablet Take 1 tablet (500 mg total) by mouth 2 (two) times daily. 08/07/23   Charmayne Cooper, MD  naproxen  (NAPROSYN ) 500 MG tablet Take 1 tablet (500 mg total) by mouth 2 (two) times daily. 08/07/23   Charmayne Cooper, MD      Allergies    Patient has no known allergies.    Review of Systems   Review of Systems  Gastrointestinal:  Positive for abdominal pain and diarrhea.    Physical Exam Updated Vital Signs BP 112/78 (BP Location: Right Arm)   Pulse 71   Temp 97.9 F (36.6 C) (Oral)   Resp 20   Ht 5\' 6"  (1.676 m)   Wt 64.9 kg   SpO2 100%   BMI 23.08 kg/m  Physical Exam Vitals and nursing note reviewed.  Constitutional:      General: He is not in acute distress.    Appearance: He is well-developed.  HENT:     Head:  Normocephalic and atraumatic.  Eyes:     Conjunctiva/sclera: Conjunctivae normal.  Cardiovascular:     Rate and Rhythm: Normal rate and regular rhythm.     Heart sounds: Normal heart sounds. No murmur heard. Pulmonary:     Effort: Pulmonary effort is normal. No respiratory distress.     Breath sounds: Normal breath sounds.  Abdominal:     General: There is no distension.     Palpations: Abdomen is soft.     Tenderness: There is abdominal tenderness in the right lower quadrant, periumbilical area and left lower quadrant. Negative signs include Murphy's sign and Rovsing's sign.  Musculoskeletal:        General: No swelling.     Cervical back: Neck supple.  Skin:    General: Skin is warm and dry.     Capillary Refill: Capillary refill takes less than 2 seconds.  Neurological:     Mental Status: He is alert.  Psychiatric:        Mood and Affect: Mood normal.     ED Results / Procedures / Treatments   Labs (all labs ordered are listed, but only abnormal results are displayed) Labs Reviewed  LIPASE, BLOOD  COMPREHENSIVE METABOLIC PANEL WITH GFR  URINALYSIS, ROUTINE  W REFLEX MICROSCOPIC  CBC WITH DIFFERENTIAL/PLATELET    EKG None  Radiology CT ABDOMEN PELVIS W CONTRAST Result Date: 02/22/2024 CLINICAL DATA:  LLQ abdominal pain RLQ abdominal pain Pt with generalized lower abd pain x 2-3 days; +diarrhea EXAM: CT ABDOMEN AND PELVIS WITH CONTRAST TECHNIQUE: Multidetector CT imaging of the abdomen and pelvis was performed using the standard protocol following bolus administration of intravenous contrast. RADIATION DOSE REDUCTION: This exam was performed according to the departmental dose-optimization program which includes automated exposure control, adjustment of the mA and/or kV according to patient size and/or use of iterative reconstruction technique. CONTRAST:  75mL OMNIPAQUE IOHEXOL 300 MG/ML  SOLN COMPARISON:  None Available. FINDINGS: Lower chest: No acute abnormality.  Hepatobiliary: No focal liver abnormality. No gallstones, gallbladder wall thickening, or pericholecystic fluid. No biliary dilatation. Pancreas: No focal lesion. Normal pancreatic contour. No surrounding inflammatory changes. No main pancreatic ductal dilatation. Spleen: Normal in size without focal abnormality. Adrenals/Urinary Tract: No adrenal nodule bilaterally. Bilateral kidneys enhance symmetrically. No hydronephrosis. No hydroureter. The urinary bladder is unremarkable. Stomach/Bowel: Stomach is within normal limits. No evidence of bowel wall thickening or dilatation. Appendix appears normal. Vascular/Lymphatic: No abdominal aorta or iliac aneurysm. Mild atherosclerotic plaque of the aorta and its branches. No abdominal, pelvic, or inguinal lymphadenopathy. Reproductive: Prostate is unremarkable. Other: No intraperitoneal free fluid. No intraperitoneal free gas. No organized fluid collection. Musculoskeletal: No abdominal wall hernia or abnormality. No suspicious lytic or blastic osseous lesions. No acute displaced fracture. IMPRESSION: No acute intra-abdominal or intrapelvic abnormality. Aortic Atherosclerosis (ICD10-I70.0). Electronically Signed   By: Morgane  Naveau M.D.   On: 02/22/2024 22:29    Procedures Procedures    Medications Ordered in ED Medications  cefTRIAXone  (ROCEPHIN ) injection 500 mg (has no administration in time range)  azithromycin  (ZITHROMAX ) tablet 1,000 mg (has no administration in time range)  iohexol (OMNIPAQUE) 300 MG/ML solution 75 mL (75 mLs Intravenous Contrast Given 02/22/24 2035)    ED Course/ Medical Decision Making/ A&P                                 Medical Decision Making Amount and/or Complexity of Data Reviewed Labs: ordered. Radiology: ordered.  Risk Prescription drug management.   This patient presents to the ED for concern of abdominal pain, this involves an extensive number of treatment options, and is a complaint that carries with it a  high risk of complications and morbidity.  The differential diagnosis includes appendicitis, viral GI illness, UTI, kidney stone, pancreatitis, diverticulitis, SBO, UC, choledocholithiasis, acute cholecystitis    Lab Tests:  I Ordered, and personally interpreted labs.  The pertinent results include: No notable findings   Imaging Studies ordered:  I ordered imaging studies including CT abdomen/pelvis with contrast I independently visualized and interpreted imaging which showed no acute intra-abdominal or intrapelvic abnormality I agree with the radiologist interpretation  Upon reassessment patient informed myself that one of his partners has tested positive for gonorrhea.  Patient will be prophylactically treated for gonorrhea and chlamydia with IM Rocephin  and p.o. azithromycin .  Test / Admission - Considered:  Consider for admission or further workup however patient's vital signs, physical exam, labs, and imaging have been reassuring.  Patient's symptoms likely due to acute diarrhea.  Patient encouraged to take over-the-counter Imodium as needed for diarrhea.  Patient to follow-up with primary care for further evaluation and workup if his symptoms persist.     Final Clinical Impression(s) /  ED Diagnoses Final diagnoses:  Diarrhea, unspecified type  Generalized abdominal pain  STD exposure    Rx / DC Orders ED Discharge Orders     None         Carie Charity, PA-C 02/22/24 2234    Carie Charity, PA-C 02/22/24 2239    Sallyanne Creamer, DO 02/24/24 1324

## 2024-02-22 NOTE — ED Triage Notes (Signed)
 Pt with generalized lower abd pain x 2-3 days; +diarrhea

## 2024-04-08 NOTE — ED Provider Notes (Signed)
 [facility] Emergency Department Physician Note  ED Course - HPI and Medical Decision Making  Chief Complaint: Sensitive Issue Male   History is obtained from Patient HPI: This is a 28 y.o. male who presents to the emergency department with concerns for STI. Reports some irritation with voiding the last few days. Denies discharge. No testicular pain. Normal bowel movements. No fevers or vomiting. New partner. Declines testing for HIV and syphillis. Requests treatment, prior to results.   MDM and Assessment: -Differential diagnosis includes, but not limited to: UTI, STI, testicular torsion, uretalithiasis, urethritis  -Initial plan: Place patient on telemetry with continuous pulse oximeter reading and vital signs per emergency protocol, Urinalysis to evaluate for hematuria, infection, crystals, and gc/chlamydia testing  -Cardiac telemetry ordered for continuous cardiac and hemodynamic monitoring; reviewed telemetry showing Normal sinus rhythm without arrhythmia  -Patient presents with dysuria and c/f STI, requesting treatment.  UA non-infectious. Will treat with rocephin  and azithromycin  (as doxycycline  deferred d/t c/f compliance as an outpatient). GC/C pending. No lesions on GU exam. No testicular tenderness or reported symptoms. No further work-up indicated.  On reevaluation, patient appears well. Afebrile, hemodynamically stable, sPO2 stable, and is stable for discharge home. Follow-up was recommended and return precautions discussed.     Medical Decision Making Problems Addressed: Concern about STI in male without diagnosis: complicated acute illness or injury Urethritis: complicated acute illness or injury  Amount and/or Complexity of Data Reviewed Labs: ordered.  Risk Prescription drug management.    Factors Impacting ED Encounter Complexity: Decision regarding hospitalization or escalation of hospital level care and Discussion regarding  prescription drug management  Impression  Patient's presentation is most consistent with acute presentation with potential threat to life or bodily function. 1. Concern about STI in male without diagnosis   2. Urethritis    ED Disposition     ED Disposition  Discharge   Condition  Stable   Comment  --        Follow-up [facility] [phone] Schedule an appointment as soon as possible for a visit    Review of Systems  A pertinent review of systems was obtained and was negative except as noted in the HPI and MDM. Physical Exam   ED Triage Vitals  Temp 04/08/24 0420 98 F (36.7 C)  Heart Rate 04/08/24 0420 61  Resp 04/08/24 0420 16  BP 04/08/24 0420 151/86  MAP (mmHg) 04/08/24 0420 108  SpO2 04/08/24 0420 99 %  O2 Device 04/08/24 0415 None (Room air)  O2 Flow Rate (L/min) --   Weight 04/08/24 0417 67.9 kg (149 lb 9.6 oz)   Physical Exam  Constitutional Nursing notes reviewed Vital signs reviewed  HEENT No obvious trauma Supple without meningismus Pupils cross midline No scleral icterus or injection  Respiratory Effort normal CTAB No respiratory distress  CV Normal rate No pitting edema 2+ radial and DP pulses  Abdomen Soft Non-tender Non-distended No peritonitis Chaperoned GU exam: No lesions, testicles normal, no discharge  MSK Atraumatic No obvious deformity ROM appropriate  Skin Warm Dry  Neuro Awake and alert Pupils cross midline Moving all extremities  Psychiatric Mood and affect normal    Other pertinent exam findings per MDM-ED Course Procedures  Procedures

## 2024-07-20 ENCOUNTER — Encounter (HOSPITAL_BASED_OUTPATIENT_CLINIC_OR_DEPARTMENT_OTHER): Payer: Self-pay | Admitting: Emergency Medicine

## 2024-07-20 ENCOUNTER — Emergency Department (HOSPITAL_BASED_OUTPATIENT_CLINIC_OR_DEPARTMENT_OTHER)
Admission: EM | Admit: 2024-07-20 | Discharge: 2024-07-20 | Disposition: A | Discharge status: Home / Self Care | Payer: Self-pay | Attending: Emergency Medicine | Admitting: Emergency Medicine

## 2024-07-20 ENCOUNTER — Other Ambulatory Visit (HOSPITAL_BASED_OUTPATIENT_CLINIC_OR_DEPARTMENT_OTHER): Payer: Self-pay

## 2024-07-20 ENCOUNTER — Other Ambulatory Visit: Payer: Self-pay

## 2024-07-20 DIAGNOSIS — J45909 Unspecified asthma, uncomplicated: Secondary | ICD-10-CM | POA: Insufficient documentation

## 2024-07-20 DIAGNOSIS — J069 Acute upper respiratory infection, unspecified: Secondary | ICD-10-CM | POA: Insufficient documentation

## 2024-07-20 LAB — RESP PANEL BY RT-PCR (RSV, FLU A&B, COVID)  RVPGX2
Influenza A by PCR: NEGATIVE
Influenza B by PCR: NEGATIVE
Resp Syncytial Virus by PCR: NEGATIVE
SARS Coronavirus 2 by RT PCR: NEGATIVE

## 2024-07-20 MED ORDER — ONDANSETRON 4 MG PO TBDP
4.0000 mg | ORAL_TABLET | Freq: Three times a day (TID) | ORAL | 0 refills | Status: AC | PRN
Start: 2024-07-20 — End: ?
  Filled 2024-07-20: qty 20, 7d supply, fill #0

## 2024-07-20 MED ORDER — PROMETHAZINE-DM 6.25-15 MG/5ML PO SYRP
5.0000 mL | ORAL_SOLUTION | Freq: Four times a day (QID) | ORAL | 0 refills | Status: AC | PRN
Start: 2024-07-20 — End: ?
  Filled 2024-07-20: qty 118, 6d supply, fill #0

## 2024-07-20 NOTE — Discharge Instructions (Signed)
 Your work-up in the ER today was reassuring for acute findings. You were swabbed for COVID, flu, and RSV which were negative. However, your symptoms are still likely related to an upper respiratory infection. As these are almost always viral in nature, no antibiotics are indicated. I recommend that you get plenty of rest and focus on symptomatic relief which includes Cepacol throat lozenges for sore throat, Mucinex  D (orange box) which you can get from behind the counter at your local pharmacy for congestion, and tylenol /ibuprofen  as needed for fevers and bodyaches. I have also given you a prescription for Zofran  and promethazine .  Zofran  is for nausea and vomiting and promethazine  is for your cough.  Please do not drive or operate heavy machinery while taking the promethazine  as it can be sedating.  I also recommend:  Increased fluid intake. Sports drinks offer valuable electrolytes, sugars, and fluids.  Breathing heated mist or steam (vaporizer or shower).  Eating chicken soup or other clear broths, and maintaining good nutrition.   Increasing usage of your inhaler if you have asthma.  Return to work when your temperature has returned to normal.  Gargle warm salt water and spit it out for sore throat. Take benadryl  or Zyrtec to decrease sinus secretions.  Follow Up: Follow up with your primary care doctor in 5-7 days for recheck of ongoing symptoms.  Return to emergency department for emergent changing or worsening of symptoms.

## 2024-07-20 NOTE — ED Triage Notes (Signed)
 Stuffy nose and cough sweating started this weekend

## 2024-07-20 NOTE — ED Provider Notes (Signed)
 [place] [facility] AT [facility] Provider Note   CSN: [id number] Arrival date & time: 07/20/24  1132     Patient presents with: URI   Jerry Acosta is a 28 y.o. male.   Patient with no pertinent past medical history presents today with complaints of cough and congestion.  He states that his symptoms began over the weekend and have been persistent since then.  He does report a history of childhood asthma, however has not had any issues with asthma since he was a teenager.  Does report he had 1 episode of vomiting this morning, denies feeling nauseous anymore.  He has been able to drink some since then without residual vomiting.  Denies any abdominal pain.  No fevers or chills.  He has been taking Robitussin with some improvement.  Denies chest pain or shortness of breath.  The history is provided by the patient. No language interpreter was used.  URI Presenting symptoms: congestion and cough        Prior to Admission medications   Medication Sig Start Date End Date Taking? Authorizing Provider  acetaminophen  (TYLENOL ) 325 MG tablet Take 2 tablets (650 mg total) by mouth every 6 (six) hours as needed. 10/16/23   Elnor Jayson LABOR, DO  doxycycline  (VIBRAMYCIN ) 100 MG capsule Take 1 capsule (100 mg total) by mouth 2 (two) times daily. 08/07/23   Roselyn Carlin NOVAK, MD  guaiFENesin -codeine  100-10 MG/5ML syrup Take 5 mLs by mouth 3 (three) times daily as needed for cough. 10/16/23   Elnor Jayson LABOR, DO  ibuprofen  (ADVIL ) 600 MG tablet Take 1 tablet (600 mg total) by mouth every 6 (six) hours as needed. 10/16/23   Elnor Jayson LABOR, DO  methocarbamol  (ROBAXIN ) 500 MG tablet Take 1 tablet (500 mg total) by mouth 2 (two) times daily. 08/07/23   Roselyn Carlin NOVAK, MD  naproxen  (NAPROSYN ) 500 MG tablet Take 1 tablet (500 mg total) by mouth 2 (two) times daily. 08/07/23   Roselyn Carlin NOVAK, MD    Allergies: Patient has no known allergies.    Review of Systems  HENT:  Positive  for congestion.   Respiratory:  Positive for cough.   Gastrointestinal:  Positive for vomiting (1 episode, resolved).  All other systems reviewed and are negative.   Updated Vital Signs BP 129/80 (BP Location: Left Arm)   Pulse 72   Temp 98.5 F (36.9 C) (Oral)   Resp 17   Ht 5' 6 (1.676 m)   Wt 71.2 kg   SpO2 100%   BMI 25.34 kg/m   Physical Exam Vitals and nursing note reviewed.  Constitutional:      General: He is not in acute distress.    Appearance: Normal appearance. He is normal weight. He is not ill-appearing, toxic-appearing or diaphoretic.  HENT:     Head: Normocephalic and atraumatic.     Mouth/Throat:     Mouth: Mucous membranes are moist.  Eyes:     Extraocular Movements: Extraocular movements intact.     Pupils: Pupils are equal, round, and reactive to light.  Cardiovascular:     Rate and Rhythm: Normal rate and regular rhythm.     Heart sounds: Normal heart sounds.  Pulmonary:     Effort: Pulmonary effort is normal. No respiratory distress.     Breath sounds: Normal breath sounds.  Abdominal:     General: Abdomen is flat.     Palpations: Abdomen is soft.     Tenderness: There is no abdominal  tenderness.  Musculoskeletal:        General: Normal range of motion.     Cervical back: Normal range of motion and neck supple.  Skin:    General: Skin is warm and dry.  Neurological:     General: No focal deficit present.     Mental Status: He is alert.  Psychiatric:        Mood and Affect: Mood normal.        Behavior: Behavior normal.     (all labs ordered are listed, but only abnormal results are displayed) Labs Reviewed  RESP PANEL BY RT-PCR (RSV, FLU A&B, COVID)  [id number]    EKG: None  Radiology: No results found.   Procedures   Medications Ordered in the ED - No data to display                                  Medical Decision Making  Patient presents today with complaints of cough and congestion x 2 days.  They are afebrile,  nontoxic-appearing, and in no acute distress with reassuring vital signs.  Physical exam reveals lung sounds clear to auscultation in all fields. No indication for CXR imaging at this time. Patient negative for COVID, flu, and RSV.  However, patient's symptoms likely due to URI, likely viral etiology.  Discussed with patient that there is no indication for antibiotics for viral infections. Will send for Zofran  for nausea and vomiting as well as some cough syrup for cough suppression.  He has not had any residual nausea or vomiting since this morning and has stable vital signs, therefore no indication for further evaluation with labs.  Recommend close outpatient follow-up with return precautions. Evaluation and diagnostic testing in the emergency department does not suggest an emergent condition requiring admission or immediate intervention beyond what has been performed at this time.  Plan for discharge with close PCP follow-up.  Patient is understanding and amenable with plan, educated on red flag symptoms that would prompt immediate return.  Patient discharged in stable condition.  Final diagnoses:  Viral URI with cough    ED Discharge Orders          Ordered    ondansetron  (ZOFRAN -ODT) 4 MG disintegrating tablet  Every 8 hours PRN        07/20/24 1257    promethazine -dextromethorphan (PROMETHAZINE -DM) 6.25-15 MG/5ML syrup  4 times daily PRN        07/20/24 1257          An After Visit Summary was printed and given to the patient.      Nora Lauraine DELENA DEVONNA 07/20/24 1258    Darra Fonda MATSU, MD 07/21/24 [phone]
